# Patient Record
Sex: Female | Born: 1955 | Race: Black or African American | Hispanic: No | Marital: Single | Smoking: Never smoker
Health system: Southern US, Community
[De-identification: ages and names within clinical notes are randomized; demographics above are authoritative.]

---

## 2004-12-16 ENCOUNTER — Emergency Department: Payer: Self-pay | Admitting: Emergency Medicine

## 2004-12-17 ENCOUNTER — Other Ambulatory Visit: Payer: Self-pay

## 2005-03-18 ENCOUNTER — Emergency Department: Payer: Self-pay | Admitting: Emergency Medicine

## 2005-03-29 ENCOUNTER — Emergency Department: Payer: Self-pay | Admitting: Emergency Medicine

## 2006-07-07 IMAGING — CR CERVICAL SPINE - 2-3 VIEW
1 series · 4 of 4 positions shown · non-contrast
Comparison: none

REASON FOR EXAM: neck pain       rm 14
COMMENTS:

[Series 1: view not recorded · 0.17mm/px · 4 of 4 slices shown]
[im 1/4]
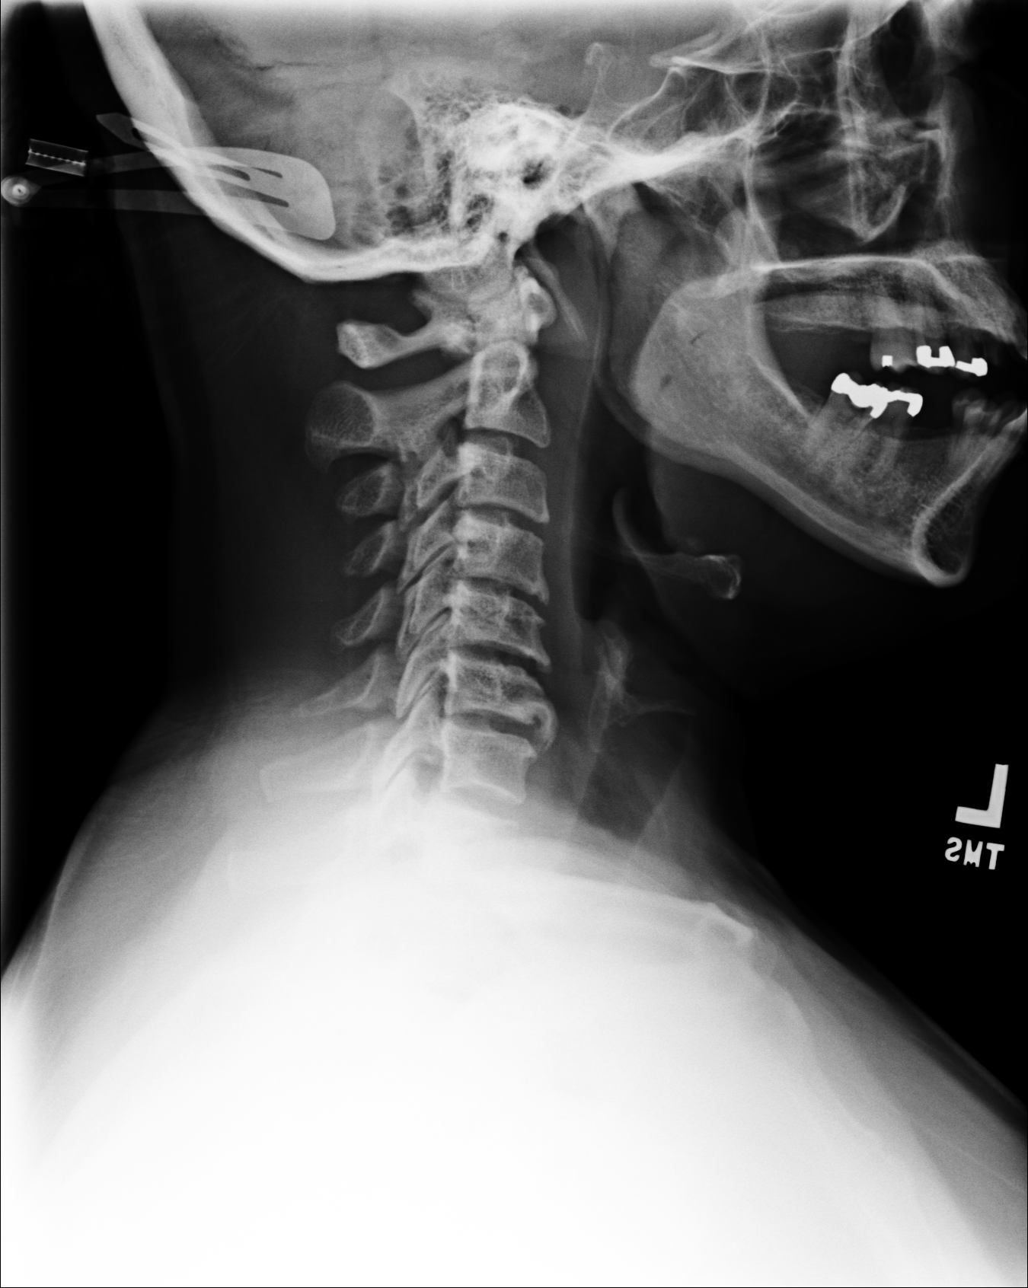
[im 2/4]
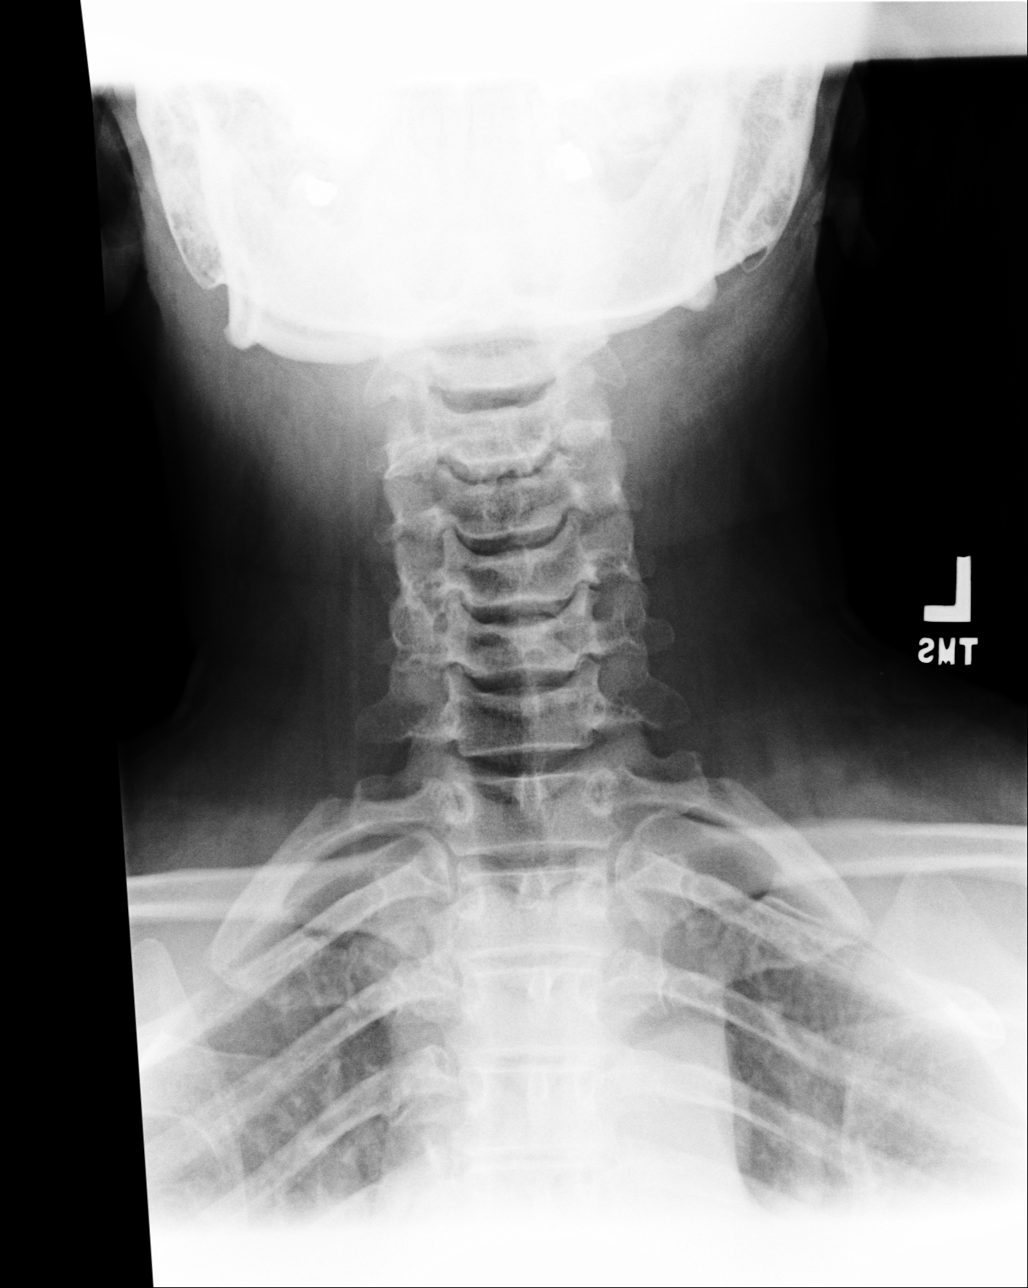
[im 3/4]
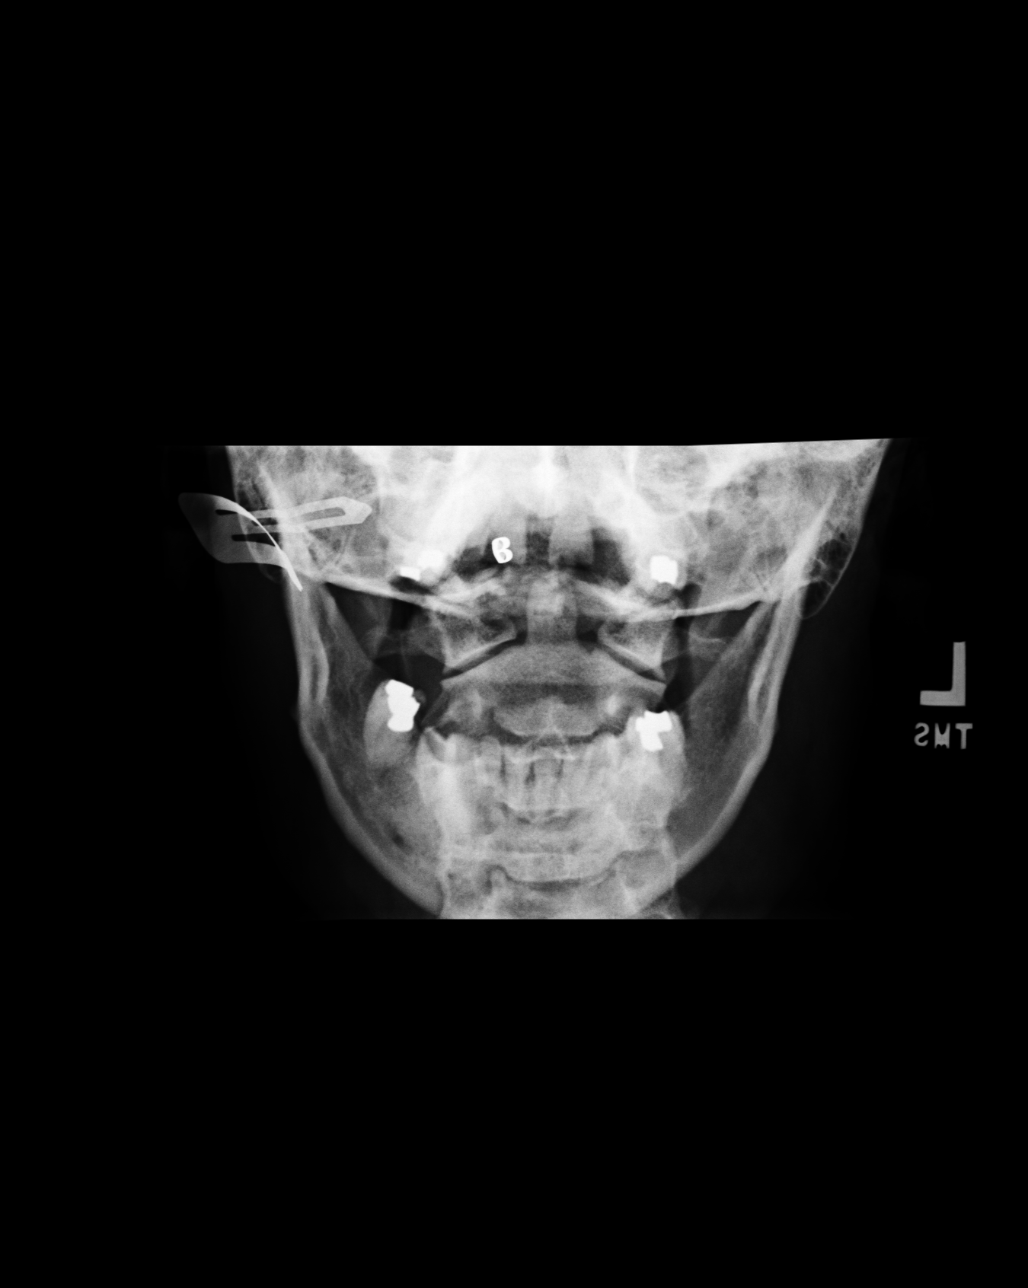
[im 4/4]
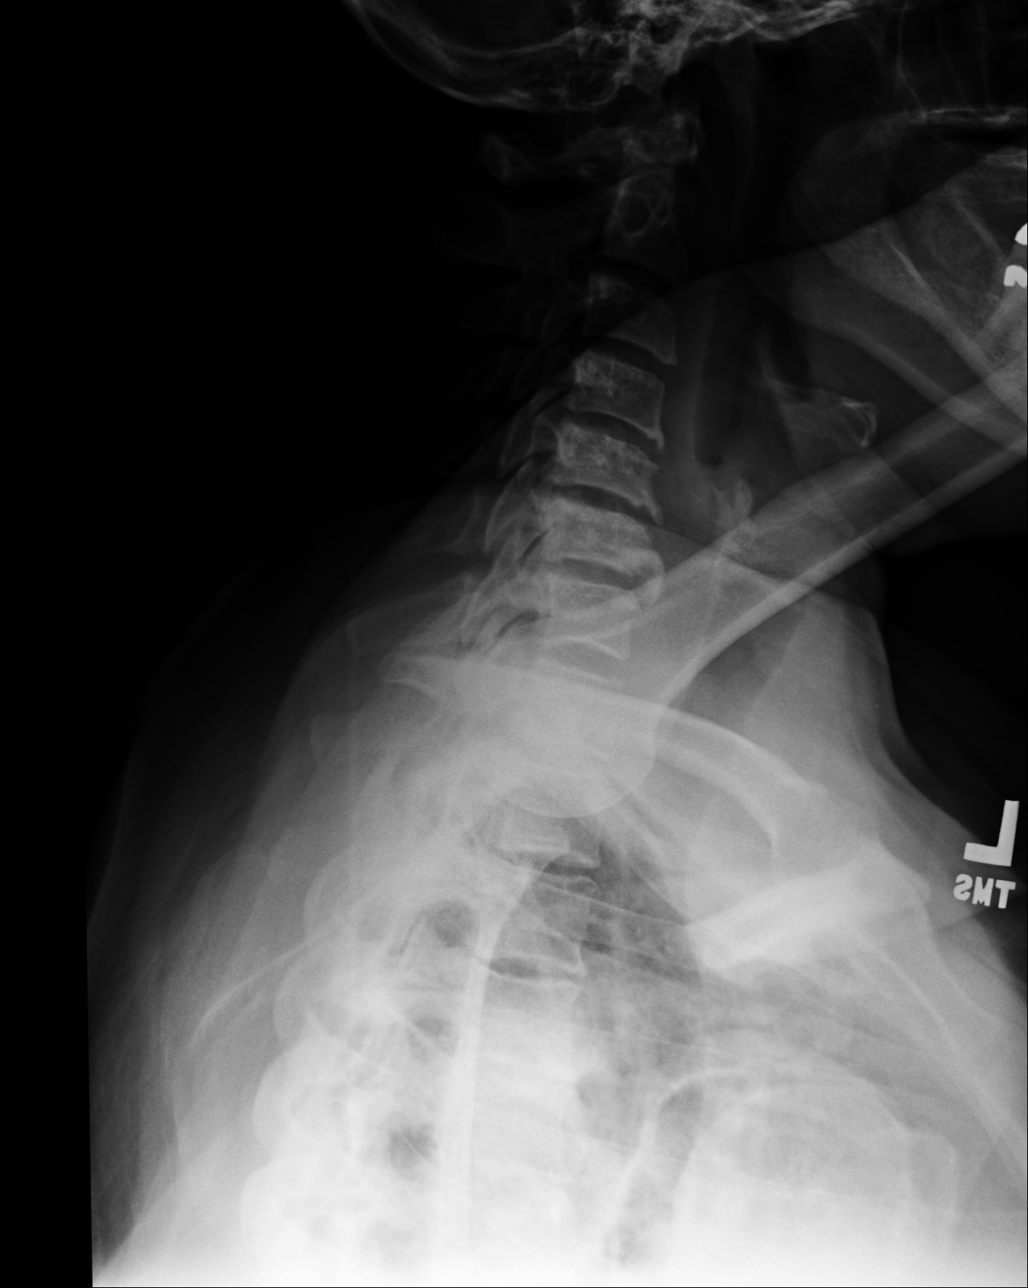

[4 of 4 positions shown; findings below may reference images not displayed]

PROCEDURE:     DXR - DXR C- SPINE AP AND LATERAL  - March 18, 2005  [DATE]

RESULT:       The vertebral body heights are well maintained.  No fracture
is seen.  No definite disc space narrowing is observed.  There is noted
hypertrophic spurring anteriorly at multiple levels of the cervical spine.
In the lateral view there is absence of the usual lordotic curvature.  This
is a nonspecific finding which can be chronic, positional or secondary to
cervical muscle spasm.  No cervical rib formation is seen.
IMPRESSION: 1.     No fracture is identified.
2.     Degenerative spurring is noted anteriorly at multiple levels.
3.     There is straightening of the cervical spine which would raise the
question of cervical muscle spasm.

## 2009-05-29 ENCOUNTER — Emergency Department: Payer: Self-pay | Admitting: Emergency Medicine

## 2013-04-19 ENCOUNTER — Ambulatory Visit: Payer: Self-pay | Admitting: Podiatry

## 2018-12-07 ENCOUNTER — Ambulatory Visit: Payer: Self-pay | Admitting: Podiatry

## 2018-12-17 ENCOUNTER — Ambulatory Visit: Payer: Self-pay | Admitting: Podiatry

## 2019-01-07 ENCOUNTER — Ambulatory Visit: Payer: Self-pay | Admitting: Podiatry

## 2019-02-18 ENCOUNTER — Other Ambulatory Visit: Payer: Self-pay

## 2019-02-18 ENCOUNTER — Other Ambulatory Visit: Payer: Self-pay | Admitting: *Deleted

## 2019-02-18 ENCOUNTER — Ambulatory Visit: Payer: Self-pay | Admitting: Podiatry

## 2019-02-18 ENCOUNTER — Ambulatory Visit (INDEPENDENT_AMBULATORY_CARE_PROVIDER_SITE_OTHER): Payer: Self-pay | Admitting: Podiatry

## 2019-02-18 ENCOUNTER — Encounter: Payer: Self-pay | Admitting: Podiatry

## 2019-02-18 VITALS — BP 151/92 | HR 61

## 2019-02-18 DIAGNOSIS — Q828 Other specified congenital malformations of skin: Secondary | ICD-10-CM

## 2019-02-18 DIAGNOSIS — M722 Plantar fascial fibromatosis: Secondary | ICD-10-CM

## 2019-02-18 NOTE — Progress Notes (Signed)
This patient presents the office with chief complaint of a painful callus in the center of her right foot.  She says this callus has been present for over a year and causes pain walking and wearing her shoes.  She says she has provided no self treatment nor sought any professional help for this callus problem.  She says this callus has become increasingly painful and she presents the office today for an evaluation and treatment of this callus.  Patient gives a history of having been hospitalized for cellulitis in her right leg and foot.  She also has significant lymphedema both legs and feet.    General Appearance  Alert, conversant and in no acute stress.  Vascular  Dorsalis pedis and posterior tibial  pulses are weakly  palpable  Bilaterally despite swelling both feet..  Capillary return is within normal limits  bilaterally. Temperature is within normal limits  bilaterally.  Neurologic  Senn-Weinstein monofilament wire test within normal limits  bilaterally. Muscle power within normal limits bilaterally.  Nails Thick disfigured discolored nails with subungual debris  from hallux to fifth toes bilaterally. No evidence of bacterial infection or drainage bilaterally.  Orthopedic  No limitations of motion  feet .  No crepitus or effusions noted.  No bony pathology or digital deformities noted.  Pes planus  B/l.  Skin  normotropic skin with no porokeratosis noted bilaterally.  No signs of infections or ulcers noted.  Porokeratosis sub 4th MCJ right foot.  Porokeratosis right foot.  IE.  Debride porokeratosis  Right foot.  RTC prn.  Gardiner Barefoot DPM

## 2019-02-24 ENCOUNTER — Other Ambulatory Visit: Payer: Self-pay

## 2019-02-24 ENCOUNTER — Encounter: Payer: Self-pay | Attending: Internal Medicine | Admitting: Internal Medicine

## 2019-02-24 DIAGNOSIS — I890 Lymphedema, not elsewhere classified: Secondary | ICD-10-CM | POA: Insufficient documentation

## 2019-02-24 NOTE — Progress Notes (Addendum)
Jaclyn, Kramer ([id number]) Visit Report for 02/24/2019 Allergy List Details Patient Name: Jaclyn Kramer, Jaclyn Kramer Date of Service: 02/24/2019 2:15 PM Medical Record Number: [id number] Patient Account Number: [id number] Date of Birth/Sex: 09/25/1955 (63 y.o. F) Treating RN: Curtis Sites Primary Care Tonee Silverstein: PATIENT, NO Other Clinician: Referring Hyla Coard: Referral, Self Treating Ashten Prats/Extender: Maxwell Caul Weeks in Treatment: 0 Allergies Active Allergies No Known Drug Allergies Allergy Notes Electronic Signature(s) Signed: 02/24/2019 4:36:54 PM By: Curtis Sites Entered By: Curtis Sites on 02/24/2019 14:38:49 Jaclyn Kramer ([id number]) -------------------------------------------------------------------------------- Arrival Information Details Patient Name: Jaclyn Kramer Date of Service: 02/24/2019 2:15 PM Medical Record Number: [id number] Patient Account Number: [id number] Date of Birth/Sex: 1956-05-16 (63 y.o. F) Treating RN: Huel Coventry Primary Care Troi Florendo: PATIENT, NO Other Clinician: Referring Darriel Utter: Referral, Self Treating Cimone Fahey/Extender: Altamese [place] in Treatment: 0 Visit Information Patient Arrived: Ambulatory Arrival Time: 14:30 Accompanied By: self Transfer Assistance: None Patient Identification Verified: Yes Secondary Verification Process Completed: Yes Electronic Signature(s) Signed: 02/24/2019 4:24:56 PM By: Dayton Martes RCP, RRT, CHT Entered By: Dayton Martes on 02/24/2019 14:30:47 Jaclyn Kramer ([id number]) -------------------------------------------------------------------------------- Clinic Level of Care Assessment Details Patient Name: Jaclyn, Kramer Date of Service: 02/24/2019 2:15 PM Medical Record Number: [id number] Patient Account Number: [id number] Date of Birth/Sex: May 01, 1956 (63 y.o. F) Treating RN: Huel Coventry Primary Care Cheyne Boulden: PATIENT, NO Other Clinician: Referring Virgal Warmuth: Referral,  Self Treating Nyisha Clippard/Extender: Altamese [place] in Treatment: 0 Clinic Level of Care Assessment Items TOOL 2 Quantity Score []  - Use when only an EandM is performed on the INITIAL visit 0 ASSESSMENTS - Nursing Assessment / Reassessment X - General Physical Exam (combine w/ comprehensive assessment (listed just below) when 1 20 performed on new pt. evals) X- 1 25 Comprehensive Assessment (HX, ROS, Risk Assessments, Wounds Hx, etc.) ASSESSMENTS - Wound and Skin Assessment / Reassessment X - Simple Wound Assessment / Reassessment - one wound 1 5 []  - 0 Complex Wound Assessment / Reassessment - multiple wounds []  - 0 Dermatologic / Skin Assessment (not related to wound area) ASSESSMENTS - Ostomy and/or Continence Assessment and Care []  - Incontinence Assessment and Management 0 []  - 0 Ostomy Care Assessment and Management (repouching, etc.) PROCESS - Coordination of Care X - Simple Patient / Family Education for ongoing care 1 15 []  - 0 Complex (extensive) Patient / Family Education for ongoing care []  - 0 Staff obtains Chiropractor, Records, Test Results / Process Orders []  - 0 Staff telephones HHA, Nursing Homes / Clarify orders / etc []  - 0 Routine Transfer to another Facility (non-emergent condition) []  - 0 Routine Hospital Admission (non-emergent condition) []  - 0 New Admissions / Manufacturing engineer / Ordering NPWT, Apligraf, etc. []  - 0 Emergency Hospital Admission (emergent condition) X- 1 10 Simple Discharge Coordination []  - 0 Complex (extensive) Discharge Coordination PROCESS - Special Needs []  - Pediatric / Minor Patient Management 0 []  - 0 Isolation Patient Management ROSIE, REIF ([id number]) []  - 0 Hearing / Language / Visual special needs []  - 0 Assessment of Community assistance (transportation, D/C planning, etc.) []  - 0 Additional assistance / Altered mentation []  - 0 Support Surface(s) Assessment (bed, cushion, seat, etc.) INTERVENTIONS  - Wound Cleansing / Measurement []  - Wound Imaging (photographs - any number of wounds) 0 []  - 0 Wound Tracing (instead of photographs) X- 1 5 Simple Wound Measurement - one wound []  - 0 Complex Wound Measurement - multiple wounds []  - 0 Simple Wound Cleansing - one wound []  - 0 Complex Wound Cleansing -  multiple wounds INTERVENTIONS - Wound Dressings []  - Small Wound Dressing one or multiple wounds 0 []  - 0 Medium Wound Dressing one or multiple wounds []  - 0 Large Wound Dressing one or multiple wounds []  - 0 Application of Medications - injection INTERVENTIONS - Miscellaneous []  - External ear exam 0 []  - 0 Specimen Collection (cultures, biopsies, blood, body fluids, etc.) []  - 0 Specimen(s) / Culture(s) sent or taken to Lab for analysis []  - 0 Patient Transfer (multiple staff / Michiel SitesHoyer Lift / Similar devices) []  - 0 Simple Staple / Suture removal (25 or less) []  - 0 Complex Staple / Suture removal (26 or more) []  - 0 Hypo / Hyperglycemic Management (close monitor of Blood Glucose) []  - 0 Ankle / Brachial Index (ABI) - do not check if billed separately Has the patient been seen at the hospital within the last three years: Yes Total Score: 80 Level Of Care: New/Established - Level 3 Electronic Signature(s) Signed: 02/24/2019 5:22:27 PM By: Elliot GurneyWoody, BSN, RN, CWS, Kim RN, BSN Entered By: Elliot GurneyWoody, BSN, RN, CWS, Kim on 02/24/2019 15:06:14 Jaclyn ChacoOBERTS, Nasiyah ([id number]) -------------------------------------------------------------------------------- Encounter Discharge Information Details Patient Name: Jaclyn Kramer Date of Service: 02/24/2019 2:15 PM Medical Record Number: [id number] Patient Account Number: [id number] Date of Birth/Sex: 06-21-1955 (63 y.o. F) Treating RN: Huel CoventryWoody, Kim Primary Care Dailen Mcclish: PATIENT, NO Other Clinician: Referring Berlie Hatchel: Referral, Self Treating Cache Bills/Extender: Altamese CarolinaOBSON, MICHAEL G Weeks in Treatment: 0 Encounter Discharge Information  Items Discharge Condition: Stable Ambulatory Status: Ambulatory Discharge Destination: Home Transportation: Private Auto Accompanied By: self Schedule Follow-up Appointment: Yes Clinical Summary of Care: Electronic Signature(s) Signed: 02/24/2019 5:22:27 PM By: Elliot GurneyWoody, BSN, RN, CWS, Kim RN, BSN Entered By: Elliot GurneyWoody, BSN, RN, CWS, Kim on 02/24/2019 15:08:37 Jaclyn ChacoOBERTS, Viann ([id number]) -------------------------------------------------------------------------------- Lower Extremity Assessment Details Patient Name: Jaclyn ChacoOBERTS, Charlotta Date of Service: 02/24/2019 2:15 PM Medical Record Number: [id number] Patient Account Number: [id number] Date of Birth/Sex: 06-21-1955 (63 y.o. F) Treating RN: Curtis Sitesorthy, Joanna Primary Care Romani Wilbon: PATIENT, NO Other Clinician: Referring Aalyssa Elderkin: Referral, Self Treating Kensie Susman/Extender: Maxwell CaulOBSON, MICHAEL G Weeks in Treatment: 0 Edema Assessment Assessed: [Left: No] [Right: No] Edema: [Left: Yes] [Right: Yes] Calf Left: Right: Point of Measurement: 34 cm From Medial Instep 45.5 cm 50.5 cm Ankle Left: Right: Point of Measurement: 9 cm From Medial Instep 33.5 cm 34.5 cm Vascular Assessment Pulses: Dorsalis Pedis Palpable: [Left:Yes] [Right:Yes] Doppler Audible: [Left:Yes] [Right:Yes] Posterior Tibial Palpable: [Left:No] [Right:No] Doppler Audible: [Left:Inaudible] [Right:Inaudible] Blood Pressure: Brachial: [Left:126] Ankle: [Left:Dorsalis Pedis: 150 1.19] Notes patient refused ABI in right leg r/t pain Electronic Signature(s) Signed: 02/24/2019 4:36:54 PM By: Curtis Sitesorthy, Joanna Entered By: Curtis Sitesorthy, Joanna on 02/24/2019 14:54:09 Jaclyn ChacoOBERTS, Ray ([id number]) -------------------------------------------------------------------------------- Multi Wound Chart Details Patient Name: Jaclyn ChacoOBERTS, Leandra Date of Service: 02/24/2019 2:15 PM Medical Record Number: [id number] Patient Account Number: [id number] Date of Birth/Sex: 06-21-1955 (63 y.o. F) Treating RN: Huel CoventryWoody,  Kim Primary Care Egan Sahlin: PATIENT, NO Other Clinician: Referring Duncan Alejandro: Referral, Self Treating Beola Vasallo/Extender: Maxwell CaulOBSON, MICHAEL G Weeks in Treatment: 0 Vital Signs Height(in): 63 Pulse(bpm): 58 Weight(lbs): 200 Blood Pressure(mmHg): 135/82 Body Mass Index(BMI): 35 Temperature(F): 98.5 Respiratory Rate 16 (breaths/min): Wound Assessments Treatment Notes Electronic Signature(s) Signed: 02/24/2019 5:11:23 PM By: Baltazar Najjarobson, Michael MD Entered By: Baltazar Najjarobson, Michael on 02/24/2019 15:52:15 Jaclyn ChacoROBERTS, Arleatha ([id number]) -------------------------------------------------------------------------------- Multi-Disciplinary Care Plan Details Patient Name: Jaclyn ChacoOBERTS, Eulogia Date of Service: 02/24/2019 2:15 PM Medical Record Number: [id number] Patient Account Number: [id number] Date of Birth/Sex: 06-21-1955 (63 y.o. F) Treating RN: Huel CoventryWoody, Kim Primary Care Sharman Garrott: PATIENT, NO Other Clinician: Referring Myreon Wimer: Referral, Self Treating Yalanda Soderman/Extender:  ROBSON, MICHAEL G Weeks in Treatment: 0 Active Inactive Engineer, maintenance) Signed: 03/01/2019 1:40:17 PM By: Gretta Cool, BSN, RN, CWS, Kim RN, BSN Previous Signature: 02/24/2019 5:22:27 PM Version By: Gretta Cool, BSN, RN, CWS, Kim RN, BSN Entered By: Gretta Cool, BSN, RN, CWS, Kim on 03/01/2019 13:40:17 [name] ([id number]) -------------------------------------------------------------------------------- Non-Wound Condition Assessment Details Patient Name: [name] Date of Service: 02/24/2019 2:15 PM Medical Record Number: [id number] Patient Account Number: [id number] Date of Birth/Sex: 1955/12/10 (63 y.o. F) Treating RN: Montey Hora Primary Care Marsden Zaino: PATIENT, NO Other Clinician: Referring Randolf Sansoucie: Referral, Self Treating Juanantonio Stolar/Extender: Ricard Dillon Weeks in Treatment: 0 Non-Wound Condition: Condition: Lymphedema Location: Leg Side: Bilateral Photos Electronic Signature(s) Signed: 02/24/2019 4:36:54 PM By: Montey Hora Entered By: Montey Hora on 02/24/2019 14:45:40 [name] ([id number]) -------------------------------------------------------------------------------- Pain Assessment Details Patient Name: [name] Date of Service: 02/24/2019 2:15 PM Medical Record Number: [id number] Patient Account Number: [id number] Date of Birth/Sex: 01-22-1956 (63 y.o. F) Treating RN: Cornell Barman Primary Care Gennie Eisinger: PATIENT, NO Other Clinician: Referring Mallie Linnemann: Referral, Self Treating Jennelle Pinkstaff/Extender: Ricard Dillon Weeks in Treatment: 0 Active Problems Location of Pain Severity and Description of Pain Patient Has Paino Yes Site Locations Rate the pain. Current Pain Level: 7 Pain Management and Medication Current Pain Management: Electronic Signature(s) Signed: 02/24/2019 4:24:56 PM By: Lorine Bears RCP, RRT, CHT Signed: 02/24/2019 5:22:27 PM By: Gretta Cool, BSN, RN, CWS, Kim RN, BSN Entered By: Lorine Bears on 02/24/2019 14:32:02 TRIVIA, HEFFELFINGER ([id number]) -------------------------------------------------------------------------------- Patient/Caregiver Education Details Patient Name: [name] Date of Service: 02/24/2019 2:15 PM Medical Record Number: [id number] Patient Account Number: [id number] Date of Birth/Gender: 02/28/1956 (63 y.o. F) Treating RN: Cornell Barman Primary Care Physician: PATIENT, NO Other Clinician: Referring Physician: Referral, Self Treating Physician/Extender: Tito Dine in Treatment: 0 Education Assessment Education Provided To: Patient Education Topics Provided Venous: Handouts: Other: lympedema clinic referral Methods: Explain/Verbal Responses: State content correctly Wound/Skin Impairment: Electronic Signature(s) Signed: 02/24/2019 5:22:27 PM By: Gretta Cool, BSN, RN, CWS, Kim RN, BSN Entered By: Gretta Cool, BSN, RN, CWS, Kim on 02/24/2019 15:07:16 [name]  ([id number]) -------------------------------------------------------------------------------- Vitals Details Patient Name: [name] Date of Service: 02/24/2019 2:15 PM Medical Record Number: [id number] Patient Account Number: [id number] Date of Birth/Sex: 11-23-1955 (63 y.o. F) Treating RN: Cornell Barman Primary Care Aleshka Corney: PATIENT, NO Other Clinician: Referring Zyshonne Malecha: Referral, Self Treating Jaelyn Cloninger/Extender: Tito Dine in Treatment: 0 Vital Signs Time Taken: 14:32 Temperature (F): 98.5 Height (in): 63 Pulse (bpm): 58 Source: Stated Respiratory Rate (breaths/min): 16 Weight (lbs): 200 Blood Pressure (mmHg): 135/82 Source: Measured Reference Range: 80 - 120 mg / dl Body Mass Index (BMI): 35.4 Electronic Signature(s) Signed: 02/24/2019 4:24:56 PM By: Lorine Bears RCP, RRT, CHT Entered By: Lorine Bears on 02/24/2019 14:33:30

## 2019-02-24 NOTE — Progress Notes (Signed)
Jaclyn Kramer, [name] ([id number]) Visit Report for 02/24/2019 Abuse/Suicide Risk Screen Details Patient Name: Jaclyn Kramer, Jaclyn Kramer Date of Service: 02/24/2019 2:15 PM Medical Record Number: [id number] Patient Account Number: [id number] Date of Birth/Sex: 1956/01/31 (63 y.o. F) Treating RN: Curtis Sitesorthy, Joanna Primary Care Nirvi Boehler: PATIENT, NO Other Clinician: Referring Marieme Mcmackin: Referral, Self Treating Ciarah Peace/Extender: Maxwell CaulOBSON, MICHAEL G Weeks in Treatment: 0 Abuse/Suicide Risk Screen Items Answer ABUSE RISK SCREEN: Has anyone close to you tried to hurt or harm you recentlyo No Do you feel uncomfortable with anyone in your familyo No Has anyone forced you do things that you didnot want to doo No Electronic Signature(s) Signed: 02/24/2019 4:36:54 PM By: Curtis Sitesorthy, Joanna Entered By: Curtis Sitesorthy, Joanna on 02/24/2019 14:42:33 Jaclyn ChacoOBERTS, Jaclyn Kramer ([id number]) -------------------------------------------------------------------------------- Activities of Daily Living Details Patient Name: Jaclyn ChacoOBERTS, Jaclyn Kramer Date of Service: 02/24/2019 2:15 PM Medical Record Number: [id number] Patient Account Number: [id number] Date of Birth/Sex: 1956/01/31 (63 y.o. F) Treating RN: Curtis Sitesorthy, Joanna Primary Care Burns Timson: PATIENT, NO Other Clinician: Referring Javonnie Illescas: Referral, Self Treating Kimmerly Lora/Extender: Maxwell CaulOBSON, MICHAEL G Weeks in Treatment: 0 Activities of Daily Living Items Answer Activities of Daily Living (Please select one for each item) Drive Automobile Completely Able Take Medications Completely Able Use Telephone Completely Able Care for Appearance Completely Able Use Toilet Completely Able Bath / Shower Completely Able Dress Self Completely Able Feed Self Completely Able Walk Completely Able Get In / Out Bed Completely Able Housework Completely Able Prepare Meals Completely Able Handle Money Completely Able Shop for Self Completely Able Electronic Signature(s) Signed: 02/24/2019 4:36:54 PM By: Curtis Sitesorthy, Joanna Entered By:  Curtis Sitesorthy, Joanna on 02/24/2019 14:42:55 Jaclyn ChacoOBERTS, Krystalyn ([id number]) -------------------------------------------------------------------------------- Education Screening Details Patient Name: Jaclyn ChacoOBERTS, Jaclyn Kramer Date of Service: 02/24/2019 2:15 PM Medical Record Number: [id number] Patient Account Number: [id number] Date of Birth/Sex: 1956/01/31 (63 y.o. F) Treating RN: Curtis Sitesorthy, Joanna Primary Care Rashaud Ybarbo: PATIENT, NO Other Clinician: Referring Jazia Faraci: Referral, Self Treating Buryl Bamber/Extender: Altamese CarolinaOBSON, MICHAEL G Weeks in Treatment: 0 Primary Learner Assessed: Patient Learning Preferences/Education Level/Primary Language Learning Preference: Explanation, Demonstration Highest Education Level: High School Preferred Language: English Cognitive Barrier Language Barrier: No Translator Needed: No Memory Deficit: No Emotional Barrier: No Cultural/Religious Beliefs Affecting Medical Care: No Physical Barrier Impaired Vision: No Impaired Hearing: No Decreased Hand dexterity: No Knowledge/Comprehension Knowledge Level: Medium Comprehension Level: Medium Ability to understand written Medium instructions: Ability to understand verbal Medium instructions: Motivation Anxiety Level: Calm Cooperation: Cooperative Education Importance: Acknowledges Need Interest in Health Problems: Asks Questions Perception: Coherent Willingness to Engage in Self- Medium Management Activities: Readiness to Engage in Self- Medium Management Activities: Electronic Signature(s) Signed: 02/24/2019 4:36:54 PM By: Curtis Sitesorthy, Joanna Entered By: Curtis Sitesorthy, Joanna on 02/24/2019 14:43:21 Jaclyn Kramer, [name] ([id number]) -------------------------------------------------------------------------------- Fall Risk Assessment Details Patient Name: Jaclyn ChacoOBERTS, Jaclyn Kramer Date of Service: 02/24/2019 2:15 PM Medical Record Number: [id number] Patient Account Number: [id number] Date of Birth/Sex: 1956/01/31 38(63 y.o. F) Treating RN: Curtis Sitesorthy,  Joanna Primary Care Atira Borello: PATIENT, NO Other Clinician: Referring Merlyn Bollen: Referral, Self Treating Amato Sevillano/Extender: Altamese CarolinaOBSON, MICHAEL G Weeks in Treatment: 0 Fall Risk Assessment Items Have you had 2 or more falls in the last 12 monthso 0 No Have you had any fall that resulted in injury in the last 12 monthso 0 No FALLS RISK SCREEN History of falling - immediate or within 3 months 0 No Secondary diagnosis (Do you have 2 or more medical diagnoseso) 0 No Ambulatory aid None/bed rest/wheelchair/nurse 0 Yes Crutches/cane/walker 0 No Furniture 0 No Intravenous therapy Access/Saline/Heparin Lock 0 No Gait/Transferring Normal/ bed rest/ wheelchair 0 Yes Weak (short steps with or without shuffle, stooped but  able to lift head while 0 No walking, may seek support from furniture) Impaired (short steps with shuffle, may have difficulty arising from chair, head 0 No down, impaired balance) Mental Status Oriented to own ability 0 Yes Electronic Signature(s) Signed: 02/24/2019 4:36:54 PM By: Montey Hora Entered By: Montey Hora on 02/24/2019 14:43:43 Jaclyn Kramer Morrow ([id number]) -------------------------------------------------------------------------------- Foot Assessment Details Patient Name: Jaclyn Kramer Morrow Date of Service: 02/24/2019 2:15 PM Medical Record Number: [id number] Patient Account Number: [id number] Date of Birth/Sex: 05-06-1956 (63 y.o. F) Treating RN: Montey Hora Primary Care Karis Rilling: PATIENT, NO Other Clinician: Referring Muna Demers: Referral, Self Treating Summerlynn Glauser/Extender: Ricard Dillon Weeks in Treatment: 0 Foot Assessment Items Site Locations + = Sensation present, - = Sensation absent, C = Callus, U = Ulcer R = Redness, W = Warmth, M = Maceration, PU = Pre-ulcerative lesion F = Fissure, S = Swelling, D = Dryness Assessment Right: Left: Other Deformity: No No Prior Foot Ulcer: No No Prior Amputation: No No Charcot Joint: No No Ambulatory Status:  Ambulatory Without Help Gait: Steady Electronic Signature(s) Signed: 02/24/2019 4:36:54 PM By: Montey Hora Entered By: Montey Hora on 02/24/2019 14:43:57 Jaclyn Kramer Morrow ([id number]) -------------------------------------------------------------------------------- Nutrition Risk Screening Details Patient Name: Jaclyn Kramer Morrow Date of Service: 02/24/2019 2:15 PM Medical Record Number: [id number] Patient Account Number: [id number] Date of Birth/Sex: [birth year] (63 y.o. F) Treating RN: Montey Hora Primary Care Aalia Greulich: PATIENT, NO Other Clinician: Referring Caia Lofaro: Referral, Self Treating Ilias Stcharles/Extender: Ricard Dillon Weeks in Treatment: 0 Height (in): 63 Weight (lbs): 200 Body Mass Index (BMI): 35.4 Nutrition Risk Screening Items Score Screening NUTRITION RISK SCREEN: I have an illness or condition that made me change the kind and/or amount of 0 No food I eat I eat fewer than two meals per day 0 No I eat few fruits and vegetables, or milk products 0 No I have three or more drinks of beer, liquor or wine almost every day 0 No I have tooth or mouth problems that make it hard for me to eat 0 No I don't always have enough money to buy the food I need 0 No I eat alone most of the time 0 No I take three or more different prescribed or over-the-counter drugs a day 1 Yes Without wanting to, I have lost or gained 10 pounds in the last six months 0 No I am not always physically able to shop, cook and/or feed myself 0 No Nutrition Protocols Good Risk Protocol 0 No interventions needed Moderate Risk Protocol High Risk Proctocol Risk Level: Good Risk Score: 1 Electronic Signature(s) Signed: 02/24/2019 4:36:54 PM By: Montey Hora Entered By: Montey Hora on 02/24/2019 14:43:50

## 2019-02-24 NOTE — Progress Notes (Signed)
Jaclyn, Kramer ([id number]) Visit Report for 02/24/2019 Chief Complaint Document Details Patient Name: Jaclyn Kramer, Jaclyn Kramer Date of Service: 02/24/2019 2:15 PM Medical Record Number: [id number] Patient Account Number: [id number] Date of Birth/Sex: 08-Aug-1955 (63 y.o. F) Treating RN: Huel Coventry Primary Care Provider: PATIENT, NO Other Clinician: Referring Provider: Referral, Self Treating Provider/Extender: Altamese [place] in Treatment: 0 Information Obtained from: Patient Chief Complaint 02/24/2019; patient is here for review of bilateral lower extremity swelling right greater than left Electronic Signature(s) Signed: 02/24/2019 5:11:23 PM By: Baltazar Najjar MD Entered By: Baltazar Najjar on 02/24/2019 15:53:22 Jaclyn Kramer ([id number]) -------------------------------------------------------------------------------- HPI Details Patient Name: Jaclyn Kramer Date of Service: 02/24/2019 2:15 PM Medical Record Number: [id number] Patient Account Number: [id number] Date of Birth/Sex: 11-12-1955 (63 y.o. F) Treating RN: Huel Coventry Primary Care Provider: PATIENT, NO Other Clinician: Referring Provider: Referral, Self Treating Provider/Extender: Altamese [place] in Treatment: 0 History of Present Illness HPI Description: All C admission 02/24/19 This is a 63 year old woman who tells [place] she recently was hospitalized at a hospital in [facility] for cellulitis of her right lower extremity. She just finished doxycycline this morning that she received at discharge. She has chronic bilateral edema right greater than left. She states that the she still has quite a bit of pain in the right leg and it is tender to the touch. She states she did have an ultrasound while in hospital but I do not have access to these records. She does not have a wound history. She has not worn compression on her leg. Past medical history; the patient is not a diabetic or smoker. She has a history of  plantar fasciitis, major depressive disorder, "prediabetes", history of lymphedema and arthritis. ABIs in our clinic on the right could not be tested because of discomfort. On the left this was 1.19 Electronic Signature(s) Signed: 02/24/2019 5:11:23 PM By: Baltazar Najjar MD Entered By: Baltazar Najjar on 02/24/2019 15:55:21 HAVA, [place] ([id number]) -------------------------------------------------------------------------------- Physical Exam Details Patient Name: Jaclyn Kramer Date of Service: 02/24/2019 2:15 PM Medical Record Number: [id number] Patient Account Number: [id number] Date of Birth/Sex: 06/04/1956 (63 y.o. F) Treating RN: Huel Coventry Primary Care Provider: PATIENT, NO Other Clinician: Referring Provider: Referral, Self Treating Provider/Extender: Maxwell Caul Weeks in Treatment: 0 Constitutional Sitting or standing Blood Pressure is within target range for patient.. Pulse regular and within target range for patient.Marland Kitchen Respirations regular, non-labored and within target range.. Temperature is normal and within the target range for the patient.Marland Kitchen appears in no distress. Eyes Conjunctivae clear. No discharge. Respiratory Respiratory effort is easy and symmetric bilaterally. Rate is normal at rest and on room air.. Bilateral breath sounds are clear and equal in all lobes with no wheezes, rales or rhonchi.. Cardiovascular Heart rhythm and rate regular, without murmur or gallop.. Femoral pulse on the right I believe is palpable. Pedal pulses are palpable. Nonpitting edema in the right greater than the left leg below the knee. Lymphatic None palpable in the popliteal or inguinal area. Integumentary (Hair, Skin) Other than skin changes in the right lower extremity which I think are all secondary to lymphedema no other systemic skin issues are seen. Psychiatric No evidence of depression, anxiety, or agitation. Calm, cooperative, and communicative. Appropriate interactions and  affect.. Notes Wound exam; there is no open wound here. There is however marked damage to the skin predominantly on the right anterior pre-tibia area widely. Thick hyperkeratotic nonelastic skin. Marked swelling of the right greater than left leg which is nonpitting. There is no  erythema and really no evidence of current cellulitis. Electronic Signature(s) Signed: 02/24/2019 5:11:23 PM By: Baltazar Najjar MD Entered By: Baltazar Najjar on 02/24/2019 15:57:18 Jaclyn Kramer ([id number]) -------------------------------------------------------------------------------- Physician Orders Details Patient Name: Jaclyn Kramer Date of Service: 02/24/2019 2:15 PM Medical Record Number: [id number] Patient Account Number: [id number] Date of Birth/Sex: 1956/03/19 (63 y.o. F) Treating RN: Huel Coventry Primary Care Provider: PATIENT, NO Other Clinician: Referring Provider: Referral, Self Treating Provider/Extender: Altamese [place] in Treatment: 0 Verbal / Phone Orders: No Diagnosis Coding Skin Barriers/Peri-Wound Care o Moisturizing lotion - lotion every night before bed Edema Control o Elevate legs to the level of the heart and pump ankles as often as possible Services and Therapies o Lymphedema Clinic - Bilateral Electronic Signature(s) Signed: 02/24/2019 5:11:23 PM By: Baltazar Najjar MD Signed: 02/24/2019 5:22:27 PM By: Elliot Gurney, BSN, RN, CWS, Kim RN, BSN Entered By: Elliot Gurney, BSN, RN, CWS, Kim on 02/24/2019 15:05:38 Jaclyn Kramer ([id number]) -------------------------------------------------------------------------------- Problem List Details Patient Name: Jaclyn Kramer Date of Service: 02/24/2019 2:15 PM Medical Record Number: [id number] Patient Account Number: [id number] Date of Birth/Sex: 08/06/1955 (63 y.o. F) Treating RN: Huel Coventry Primary Care Provider: PATIENT, NO Other Clinician: Referring Provider: Referral, Self Treating Provider/Extender: Altamese [place] in Treatment:  0 Active Problems ICD-10 Evaluated Encounter Code Description Active Date Today Diagnosis I89.0 Lymphedema, not elsewhere classified 02/24/2019 No Yes L03.115 Cellulitis of right lower limb 02/24/2019 No Yes Inactive Problems Resolved Problems Electronic Signature(s) Signed: 02/24/2019 5:11:23 PM By: Baltazar Najjar MD Entered By: Baltazar Najjar on 02/24/2019 15:52:04 Jaclyn Kramer ([id number]) -------------------------------------------------------------------------------- Progress Note Details Patient Name: Jaclyn Kramer Date of Service: 02/24/2019 2:15 PM Medical Record Number: [id number] Patient Account Number: [id number] Date of Birth/Sex: 06/16/1955 (63 y.o. F) Treating RN: Huel Coventry Primary Care Provider: PATIENT, NO Other Clinician: Referring Provider: Referral, Self Treating Provider/Extender: Altamese [place] in Treatment: 0 Subjective Chief Complaint Information obtained from Patient 02/24/2019; patient is here for review of bilateral lower extremity swelling right greater than left History of Present Illness (HPI) All C admission 02/24/19 This is a 63 year old woman who tells [place] she recently was hospitalized at a hospital in [facility] for cellulitis of her right lower extremity. She just finished doxycycline this morning that she received at discharge. She has chronic bilateral edema right greater than left. She states that the she still has quite a bit of pain in the right leg and it is tender to the touch. She states she did have an ultrasound while in hospital but I do not have access to these records. She does not have a wound history. She has not worn compression on her leg. Past medical history; the patient is not a diabetic or smoker. She has a history of plantar fasciitis, major depressive disorder, "prediabetes", history of lymphedema and arthritis. ABIs in our clinic on the right could not be tested because of discomfort. On the left this  was 1.19 Patient History Information obtained from Patient. Allergies No Known Drug Allergies Family History Cancer - Mother,Father, Diabetes - Siblings,Mother, Heart Disease - Father, Hypertension - Father, Lung Disease - Father, Seizures - Siblings, No family history of Hereditary Spherocytosis, Kidney Disease, Stroke, Thyroid Problems, Tuberculosis. Social History Never smoker, Marital Status - Single, Alcohol Use - Never, Drug Use - No History, Caffeine Use - Never. Medical History Hematologic/Lymphatic Patient has history of Lymphedema Denies history of Anemia, Hemophilia, Human Immunodeficiency Virus, Sickle Cell Disease Cardiovascular Patient has history of Hypertension Denies history of Angina, Arrhythmia, Congestive Heart  Failure, Coronary Artery Disease, Deep Vein Thrombosis, Hypotension, Myocardial Infarction, Peripheral Arterial Disease, Peripheral Venous Disease, Phlebitis, Vasculitis Integumentary (Skin) Denies history of History of Burn, History of pressure wounds Musculoskeletal Jaclyn ChacoROBERTS, Cadience ([id number]) Patient has history of Osteoarthritis Denies history of Gout, Rheumatoid Arthritis, Osteomyelitis Review of Systems (ROS) Constitutional Symptoms (General Health) Denies complaints or symptoms of Fatigue, Fever, Chills, Marked Weight Change. Eyes Denies complaints or symptoms of Dry Eyes, Vision Changes, Glasses / Contacts. Ear/Nose/Mouth/Throat Denies complaints or symptoms of Difficult clearing ears, Sinusitis. Hematologic/Lymphatic Denies complaints or symptoms of Bleeding / Clotting Disorders, Human Immunodeficiency Virus. Respiratory Denies complaints or symptoms of Chronic or frequent coughs, Shortness of Breath. Cardiovascular Complains or has symptoms of LE edema. Denies complaints or symptoms of Chest pain. Gastrointestinal Denies complaints or symptoms of Frequent diarrhea, Nausea, Vomiting. Endocrine Denies complaints or symptoms of Hepatitis,  Thyroid disease, Polydypsia (Excessive Thirst). Genitourinary Denies complaints or symptoms of Kidney failure/ Dialysis, Incontinence/dribbling. Immunological Denies complaints or symptoms of Hives, Itching. Integumentary (Skin) Complains or has symptoms of Swelling. Denies complaints or symptoms of Wounds, Bleeding or bruising tendency, Breakdown. Musculoskeletal Denies complaints or symptoms of Muscle Pain, Muscle Weakness. Neurologic Denies complaints or symptoms of Numbness/parasthesias, Focal/Weakness. Psychiatric Denies complaints or symptoms of Anxiety, Claustrophobia. Objective Constitutional Sitting or standing Blood Pressure is within target range for patient.. Pulse regular and within target range for patient.Marland Kitchen. Respirations regular, non-labored and within target range.. Temperature is normal and within the target range for the patient.Marland Kitchen. appears in no distress. Vitals Time Taken: 2:32 PM, Height: 63 in, Source: Stated, Weight: 200 lbs, Source: Measured, BMI: 35.4, Temperature: 98.5 F, Pulse: 58 bpm, Respiratory Rate: 16 breaths/min, Blood Pressure: 135/82 mmHg. Eyes Conjunctivae clear. No discharge. Respiratory Respiratory effort is easy and symmetric bilaterally. Rate is normal at rest and on room air.. Bilateral breath sounds are clear Ohms, Rithika ([id number]) and equal in all lobes with no wheezes, rales or rhonchi.. Cardiovascular Heart rhythm and rate regular, without murmur or gallop.. Femoral pulse on the right I believe is palpable. Pedal pulses are palpable. Nonpitting edema in the right greater than the left leg below the knee. Lymphatic None palpable in the popliteal or inguinal area. Psychiatric No evidence of depression, anxiety, or agitation. Calm, cooperative, and communicative. Appropriate interactions and affect.. General Notes: Wound exam; there is no open wound here. There is however marked damage to the skin predominantly on the right anterior  pre-tibia area widely. Thick hyperkeratotic nonelastic skin. Marked swelling of the right greater than left leg which is nonpitting. There is no erythema and really no evidence of current cellulitis. Integumentary (Hair, Skin) Other than skin changes in the right lower extremity which I think are all secondary to lymphedema no other systemic skin issues are seen. Assessment Active Problems ICD-10 Lymphedema, not elsewhere classified Cellulitis of right lower limb Plan Skin Barriers/Peri-Wound Care: Moisturizing lotion - lotion every night before bed Edema Control: Elevate legs to the level of the heart and pump ankles as often as possible Services and Therapies ordered were: Lymphedema Clinic - Bilateral 1. The patient does not have any open wounds and does not need to be followed here. 2. She tells me that she has 2 other great siblings who have similar legs to hers. Nevertheless she is quite adamant that this did not happen at an early age suggesting that it did happen to recur in her 8050s. At this point I think a referral to the lymphedema clinic today is in order. I think if we  can control the swelling in the right greater than the left leg with lymphedema or wraps and manual massage we should be able to transition her safely into compression stockings. 3. The patient was recently admitted to the hospital with cellulitis I do not see evidence of that activity now. Her discomfort could be related to the edema she has especially in the right leg DEAIRRA, HALLECK ([id number]) Electronic Signature(s) Signed: 02/24/2019 5:11:23 PM By: Linton Ham MD Entered By: Linton Ham on 02/24/2019 15:59:05 [name] ([id number]) -------------------------------------------------------------------------------- ROS/PFSH Details Patient Name: [name] Date of Service: 02/24/2019 2:15 PM Medical Record Number: [id number] Patient Account Number: [id number] Date of Birth/Sex: Jul 29, 1955 (63  y.o. F) Treating RN: Montey Hora Primary Care Provider: PATIENT, NO Other Clinician: Referring Provider: Referral, Self Treating Provider/Extender: Tito Dine in Treatment: 0 Information Obtained From Patient Constitutional Symptoms (General Health) Complaints and Symptoms: Negative for: Fatigue; Fever; Chills; Marked Weight Change Eyes Complaints and Symptoms: Negative for: Dry Eyes; Vision Changes; Glasses / Contacts Ear/Nose/Mouth/Throat Complaints and Symptoms: Negative for: Difficult clearing ears; Sinusitis Hematologic/Lymphatic Complaints and Symptoms: Negative for: Bleeding / Clotting Disorders; Human Immunodeficiency Virus Medical History: Positive for: Lymphedema Negative for: Anemia; Hemophilia; Human Immunodeficiency Virus; Sickle Cell Disease Respiratory Complaints and Symptoms: Negative for: Chronic or frequent coughs; Shortness of Breath Cardiovascular Complaints and Symptoms: Positive for: LE edema Negative for: Chest pain Medical History: Positive for: Hypertension Negative for: Angina; Arrhythmia; Congestive Heart Failure; Coronary Artery Disease; Deep Vein Thrombosis; Hypotension; Myocardial Infarction; Peripheral Arterial Disease; Peripheral Venous Disease; Phlebitis; Vasculitis Gastrointestinal Complaints and Symptoms: Negative for: Frequent diarrhea; Nausea; Vomiting Endocrine KEESHA, PELLUM ([id number]) Complaints and Symptoms: Negative for: Hepatitis; Thyroid disease; Polydypsia (Excessive Thirst) Genitourinary Complaints and Symptoms: Negative for: Kidney failure/ Dialysis; Incontinence/dribbling Immunological Complaints and Symptoms: Negative for: Hives; Itching Integumentary (Skin) Complaints and Symptoms: Positive for: Swelling Negative for: Wounds; Bleeding or bruising tendency; Breakdown Medical History: Negative for: History of Burn; History of pressure wounds Musculoskeletal Complaints and Symptoms: Negative for:  Muscle Pain; Muscle Weakness Medical History: Positive for: Osteoarthritis Negative for: Gout; Rheumatoid Arthritis; Osteomyelitis Neurologic Complaints and Symptoms: Negative for: Numbness/parasthesias; Focal/Weakness Psychiatric Complaints and Symptoms: Negative for: Anxiety; Claustrophobia Immunizations Pneumococcal Vaccine: Received Pneumococcal Vaccination: No Immunization Notes: up to date Implantable Devices None Family and Social History Cancer: Yes - Mother,Father; Diabetes: Yes - Siblings,Mother; Heart Disease: Yes - Father; Hereditary Spherocytosis: No; Hypertension: Yes - Father; Kidney Disease: No; Lung Disease: Yes - Father; Seizures: Yes - Siblings; Stroke: No; Thyroid Problems: No; Tuberculosis: No; Never smoker; Marital Status - Single; Alcohol Use: Never; Drug Use: No History; Caffeine Use: Never; Financial Concerns: No; Food, Clothing or Shelter Needs: No; Support System Lacking: No; Transportation Concerns: No Electronic Signature(s) NAJMA, BOZARTH ([id number]) Signed: 02/24/2019 4:36:54 PM By: Montey Hora Signed: 02/24/2019 5:11:23 PM By: Linton Ham MD Entered By: Montey Hora on 02/24/2019 14:42:26 PAMELYN, BANCROFT ([id number]) -------------------------------------------------------------------------------- SuperBill Details Patient Name: [name] Date of Service: 02/24/2019 Medical Record Number: [id number] Patient Account Number: [id number] Date of Birth/Sex: [birth year] (63 y.o. F) Treating RN: Cornell Barman Primary Care Provider: PATIENT, NO Other Clinician: Referring Provider: Referral, Self Treating Provider/Extender: Ricard Dillon Weeks in Treatment: 0 Diagnosis Coding ICD-10 Codes Code Description I89.0 Lymphedema, not elsewhere classified L03.115 Cellulitis of right lower limb Facility Procedures CPT4 Code: 79024097 Description: 99213 - WOUND CARE VISIT-LEV 3 EST PT Modifier: Quantity: 1 Physician Procedures CPT4 Code:  3532992 Description: 42683 - WC PHYS LEVEL 2 - NEW PT ICD-10 Diagnosis Description I89.0 Lymphedema, not elsewhere  classified L03.115 Cellulitis of right lower limb Modifier: Quantity: 1 Electronic Signature(s) Signed: 02/24/2019 5:11:23 PM By: Baltazar Najjarobson, Michael MD Entered By: Baltazar Najjarobson, Michael on 02/24/2019 15:59:27

## 2019-03-10 ENCOUNTER — Ambulatory Visit: Payer: Self-pay | Attending: Internal Medicine | Admitting: Occupational Therapy

## 2019-03-10 ENCOUNTER — Encounter: Payer: Self-pay | Admitting: Occupational Therapy

## 2019-03-10 ENCOUNTER — Other Ambulatory Visit: Payer: Self-pay

## 2019-03-10 DIAGNOSIS — I890 Lymphedema, not elsewhere classified: Secondary | ICD-10-CM

## 2019-03-10 NOTE — Therapy (Deleted)
[facility] MAIN [facility] SERVICES [address] [place], [place], [address] Phone: [phone]   Fax:  [phone]  Occupational Therapy Evaluation  Patient Details  Name: Jaclyn Kramer MRN: [id number] Date of Birth: 1956-06-04 Referring Provider (OT): Baltazar Najjar, MD   Encounter Date: 03/10/2019  OT End of Session - 03/10/19 1358    Visit Number  1    Number of Visits  36    Date for OT Re-Evaluation  06/08/19       History reviewed. No pertinent past medical history.  History reviewed. No pertinent surgical history.  There were no vitals filed for this visit.  Subjective Assessment - 03/10/19 1327    Subjective   Jaclyn Kramer is referred to Occupational Therapy for evaluation and treatment of RLE lymphedema  by Baltazar Najjar, MD. Pt reports swelling in R leg started some time ago , but got much worse about a month ago after an episode of cellulitis.  Pt is unable to identify any precipitating even that cause swelling onset intially. Pt endorses family history of leg swelling in her father and also Pt has 2 sisters with leg swelling. Pt reports that two recent ultra sounds on her R leg were negative for DVT.Pt denies hx of leg wounds. Pt has not used compression stockings on the R leg because she has been unable to find any that fit. Pt tells me she has LLE compression knee highs, but she does not wear them. Pt's goal is to get the swelling down and be pain free.    Pertinent History  Hx anemia, HTN, anxiety disorder, major depressive disorder, hx non-adherance to medical treatment;    Limitations  limits ambulation, standing tolerance, climbing, difficulty fitting LB clothing and shoes. difficulty reaching feet (AROM) difficulty bathing    Repetition  Increases Symptoms        [facility] OT Assessment - 03/10/19 0001      Assessment   Medical Diagnosis  Severe , stage II RLE lymphedema    Referring Provider (OT)  Baltazar Najjar, MD    Hand  Dominance  Right    Prior Therapy  none      Balance Screen   Has the patient fallen in the past 6 months  No      Home  Environment   Alternate Level Stairs - Number of Steps  1 story    Bathroom Shower/Tub  Tub/Shower unit;Curtain    Shower/tub characteristics  Curtain    Bathroom Toilet  Handicapped height    Bathroom Accessibility  No    Additional Comments  have tub bench , but not currently using    Lives With  Family   mother, father, son, 2 brothers                                   Patient will benefit from skilled therapeutic intervention in order to improve the following deficits and impairments:           Visit Diagnosis: Lymphedema, not elsewhere classified    Problem List Patient Active Problem List   Diagnosis Date Noted  . 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 Anxiety state 11/28/2009  . Depressive disorder 11/28/2009  . Essential hypertension 11/28/2009    Jaclyn Dubonnet, MS, OTR/L, [facility] 03/10/19 9:19 PM  [facility] MAIN [facility] SERVICES [address] [place], [place], [address] Phone: [phone]   Fax:  [phone]  Name: Jaclyn Kramer MRN: [id number] Date of Birth: 10-02-1955

## 2019-03-11 ENCOUNTER — Encounter: Payer: Self-pay | Admitting: Occupational Therapy

## 2019-03-11 NOTE — Addendum Note (Signed)
Addended by: Ansel Bong on: 03/11/2019 05:17 PM   Modules accepted: Orders

## 2019-03-11 NOTE — Therapy (Deleted)
[facility] MAIN [facility] SERVICES [address] [place], [place], [address] Phone: [phone]   Fax:  [phone]  Occupational Therapy Evaluation  Patient Details  Name: Jaclyn ChacoDale Beissel MRN: [id number] Date of Birth: [birth year] Referring Provider (OT): Baltazar NajjarMichael Robson, MD   Encounter Date: 03/10/2019  OT End of Session - 03/10/19 1358    Visit Number  1    Number of Visits  36    Date for OT Re-Evaluation  06/08/19       History reviewed. No pertinent past medical history.  History reviewed. No pertinent surgical history.  There were no vitals filed for this visit.  Subjective Assessment - 03/10/19 1327    Subjective   Jaclyn ChacoDale Kramer is referred to Occupational Therapy for evaluation and treatment of RLE lymphedema  by Baltazar NajjarMichael Robson, MD. Pt reports swelling in R leg started some time ago , but got much worse about a month ago after an episode of cellulitis.  Pt is unable to identify any precipitating event that caused initial swelling. She does endorse family history of leg swelling in her father and in her two sisters. Pt reports two recent ultra sounds on her R leg performed during cellulitis episode were negative for DVT.Pt denies hx of leg wounds and ulcers.  She has not used compression stockings in the past because they hurt her legs.  Pt's stated goals are to get the swelling down in her legs and be pain free.    Patient is accompanied by:  Family member   mother, Jaclyn Kramer   Pertinent History  Hx anemia, HTN, anxiety disorder, major depressive disorder, hx non-adherance to medical treatment;    Limitations  limits ambulation, standing tolerance, transfers, bed mobility, ascending and descending stairs, difficulty fitting LB clothing and shoes. difficulty reaching feet (AROM) difficulty bathing    Repetition  Increases Symptoms    Special Tests  + Stemmer dorsal feet Lymphedema Life Impact Scale (LLIS) TBA next visit    Currently in Pain?   Yes    Pain Score  5     Pain Location  Leg    Pain Orientation  Right;Left    Pain Descriptors / Indicators  Aching;Pressure;Tender;Tightness;Heaviness;Sore;Tiring    Pain Type  Chronic pain    Pain Onset  More than a month ago    Pain Frequency  Intermittent    Aggravating Factors   standing, walking, dependent sitting > 20 minutes    Pain Relieving Factors  elevation, pain meds, rubbing    Effect of Pain on Daily Activities  BLE pain swelling and associated pain limits functional ambulation and mobility, transfers, basic ADLs (bathing, LB dressing, grooming), intrumental ADLs, ( home management tasks/ activities, role performace as caregiver to daughter and aging parents, ) limits productive and work activities requiring standing, wal;king or extended dependednt sitting, limits social participation in the community, including church, limits body image and self esteme 2/2 disfigurement                                OT Long Term Goals - 03/11/19 1654      OT LONG TERM GOAL #1   Title  Pt will apply BLE, knee length, multi-layer, short stretch compression wraps daily using correct gradient techniques with min caregiver assistance     to achieve optimal limb volume reduction, to return affected limb , as closely as possible, to premorbid size and shape, to  limit infection risk, and to improve safe functional ambulation and mobility.    Baseline  dependent    Time  4    Period  Days    Status  New    Target Date  --   4th OT Rx visit     OT LONG TERM GOAL #2   Title  Pt to achieve at least 10% BLE limb volume reductions below the knees during Intensive Phase CDT to increase  standing / walking tolerance , to increase functional performance of basic and instrumental ADLs, and to limit LE progression.    Baseline  dependent    Time  12    Period  Weeks    Status  New    Target Date  06/08/19      OT LONG TERM GOAL #3   Title  Pt will be able to verbalize signs  and symptoms of cellulitis infection and identify 4 common lymphedema precautions using printed resource for reference (modified independence) to limit LE progression over time .    Baseline  Max A    Time  4    Period  Days    Status  New    Target Date  --   4th OT Rx visit     OT LONG TERM GOAL #4   Title  Pt will achieve and sustain at least 85%  compliance with daily LE self-care home program (skin care, lymphatic pumping therex, compression and simple self-MLD) ) with min CG assistance during Intensive Phase CDT  to limit  limb swelling, reduce infection risk,  limit associated pain , and limit LE progression.    Baseline  Dependent    Time  12    Period  Weeks    Status  New    Target Date  06/08/19      OT LONG TERM GOAL #5   Title  Once issued Pt will be able to don and doff appropriate compression garments and/ or devices using correct techniques and assistive devices with extra time (modified independence) by end of Intensive Phase CDT for optimal LE management to limit progression over time.    Baseline  Max A    Time  12    Period  Weeks    Status  New    Target Date  06/08/19      Long Term Additional Goals   Additional Long Term Goals  Yes      OT LONG TERM GOAL #6   Title  During self-management phase of CDT Pt will retain limb volume reductions achieved during Intensive Phase CDT with no more than 3% volume increase to limit LE progression and further functional decline.    Baseline  Max A    Time  6    Period  Months    Status  New    Target Date  09/07/19            Plan - 03/11/19 1649    Clinical Impression Statement  Jaclyn Kramer is a 63 year-old female presenting with mild, stage III lymphedema (elephantiasisi) and moderate, stage II LLE lymphedema (LE).  Chronic, progressive leg swelling and associated pain has worsened over time. BLE LE limit's Pt's functional mobility, transfers and safe ambulation. It limits balance due to body asymmetry.  LE  limits Jaclyn Kramer' ability to perform functional activities in all occupational domains, including basic and instrumental ADLs, productive activities and leisure pursuits, social participation and role performance. Appearance of disfigured, swollen  legs limits  body image and self-esteem, clothing selection and participation in preferred exercise and social activities.  Jaclyn Kramer will benefit from skilled Occupational Therapy for Intensive and Management Phase Complete Decongestive Therapy (CDT) to include manual lymphatic drainage, skin care, therapeutic exercise and compression therapy. Emphasis throughout OT course will also focus on Pt education for long term LE self-care. Without skilled OT for CDT LE will progress resulting in progression of the condition, increased    infection risk and further functional decline. Pt will require ongoing caregiver assistance with LE home program , especially gradient compression wrapping. Without ongoing caregiver assistance during the Intensive Phase, prognosis for achieving OT goals is guarded.    OT Occupational Profile and History  Comprehensive Assessment- Review of records and extensive additional review of physical, cognitive, psychosocial history related to current functional performance    Occupational performance deficits (Please refer to evaluation for details):  ADL's;Leisure;IADL's;Social Participation;Work;Other   parenting role, self esteme, body image   Body Structure / Function / Physical Skills  ADL;Obesity;Decreased knowledge of precautions;Balance;Decreased knowledge of use of DME;Flexibility;IADL;Pain;Skin integrity;Edema;Mobility;ROM;Gait    Rehab Potential  Good    Clinical Decision Making  Several treatment options, min-mod task modification necessary    Comorbidities Affecting Occupational Performance:  Presence of comorbidities impacting occupational performance    Comorbidities impacting occupational performance description:  See  SUBJECTIVE    Modification or Assistance to Complete Evaluation   Min-Moderate modification of tasks or assist with assess necessary to complete eval    OT Frequency  2x / week    OT Duration  12 weeks   and PRN   OT Treatment/Interventions  Self-care/ADL training;Manual lymph drainage;Coping strategies training;Patient/family education;Compression bandaging;Therapist, nutritional;Therapeutic activities;Manual Therapy;DME and/or AE instruction;Other (comment)   myofacial release, skin care   Plan  treat one leg at a time to limit fall risk. Fit with custom compression garments bilaterally needed to achieve correct containment and fit. Consider fitting with 32-chamber, sequential pneumatic compression device (Tactile Medical Flexitouch "pump" ). The Flexitouch SPCD is the only sequential device that duplicates proximal to distal lymphatic decongestion to return interstitial fluid through inguinal and abdominal lymphatics to the heart. A basic pneumatic device is not appropriate for this patient because it does not treat deep abdominal lymphatics essential for transporting lymphatic congestion to the thoracic duct to return to circulation.       Patient will benefit from skilled therapeutic intervention in order to improve the following deficits and impairments:   Body Structure / Function / Physical Skills: ADL, Obesity, Decreased knowledge of precautions, Balance, Decreased knowledge of use of DME, Flexibility, IADL, Pain, Skin integrity, Edema, Mobility, ROM, Gait       Visit Diagnosis: Lymphedema, not elsewhere classified - Plan: Ot plan of care cert/re-cert    Problem List Patient Active Problem List   Diagnosis Date Noted  . 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 Anxiety state 11/28/2009  . Depressive disorder 11/28/2009  . Essential hypertension 11/28/2009    Ansel Bong 03/11/2019, 5:16 PM  [place] MAIN [facility] SERVICES [address]  [place], [place], [address] Phone: [phone]   Fax:  [phone]  Name: Jaclyn Kramer MRN: [id number] Date of Birth: [birth year]

## 2019-03-11 NOTE — Therapy (Signed)
[facility] MAIN [facility] SERVICES [address] [place], [place], [address] Phone: [phone]   Fax:  [phone]  Occupational Therapy Evaluation  Patient Details  Name: Katiria Calame MRN: [id number] Date of Birth: 08/26/1955 Referring Provider (OT): Baltazar Najjar, MD   Encounter Date: 03/10/2019  OT End of Session - 03/10/19 1358    Visit Number  1    Number of Visits  36    Date for OT Re-Evaluation  06/08/19       History reviewed. No pertinent past medical history.  History reviewed. No pertinent surgical history.  There were no vitals filed for this visit.  Subjective Assessment - 03/10/19 1327    Subjective   Peyten Punches is referred to Occupational Therapy for evaluation and treatment of RLE lymphedema  by Baltazar Najjar, MD. Pt reports swelling in R leg started some time ago , but got much worse about a month ago after an episode of cellulitis.  Pt is unable to identify any precipitating event that caused initial swelling. She does endorse family history of leg swelling in her father and in her two sisters. Pt reports two recent ultra sounds on her R leg performed during cellulitis episode were negative for DVT.Pt denies hx of leg wounds and ulcers.  She has not used compression stockings in the past because they hurt her legs.  Pt's stated goals are to get the swelling down in her legs and be pain free.    Patient is accompanied by:  Family member   mother, Saya Mccoll   Pertinent History  Hx anemia, HTN, anxiety disorder, major depressive disorder, hx non-adherance to medical treatment;    Limitations  limits ambulation, standing tolerance, transfers, bed mobility, ascending and descending stairs, difficulty fitting LB clothing and shoes. difficulty reaching feet (AROM) difficulty bathing    Repetition  Increases Symptoms    Special Tests  + Stemmer dorsal feet Lymphedema Life Impact Scale (LLIS) TBA next visit    Currently in Pain?   Yes    Pain Score  5     Pain Location  Leg    Pain Orientation  Right;Left    Pain Descriptors / Indicators  Aching;Pressure;Tender;Tightness;Heaviness;Sore;Tiring    Pain Type  Chronic pain    Pain Onset  More than a month ago    Pain Frequency  Intermittent    Aggravating Factors   standing, walking, dependent sitting > 20 minutes    Pain Relieving Factors  elevation, pain meds, rubbing    Effect of Pain on Daily Activities  BLE pain swelling and associated pain limits functional ambulation and mobility, transfers, basic ADLs (bathing, LB dressing, grooming), intrumental ADLs, ( home management tasks/ activities, role performace as caregiver to daughter and aging parents, ) limits productive and work activities requiring standing, wal;king or extended dependednt sitting, limits social participation in the community, including church, limits body image and self esteme 2/2 disfigurement        [facility] OT Assessment - 03/11/19 0001      Assessment   Medical Diagnosis  Mild, stage III , RLE lymphedema (elephantiasis); moderrate , stage II, LLE lymphedema. Suspect condition may be hereditary Lymphedema Tarda    Referring Provider (OT)  Baltazar Najjar, MD    Hand Dominance  Right    Prior Therapy  none      Precautions   Precaution Comments  LE skin precautions for cellulitis reoccurence and pre diabetes      Balance Screen  Has the patient fallen in the past 6 months  No    Has the patient had a decrease in activity level because of a fear of falling?   Yes      Home  Environment   Living Arrangements  Parent    Type of Home  House    Home Access  Stairs    Alternate Level Stairs - Number of Steps  1 story    Bathroom Shower/Tub  Tub/Shower unit;Curtain    Shower/tub characteristics  Curtain    Bathroom Toilet  Handicapped height    Bathroom Accessibility  No    Additional Comments  have tub bench , but not currently using    Lives With  Family   mother, father, son, 2 brothers      Prior Function   Level of Independence  Independent;Independent with household mobility without device;Independent with community mobility without device;Independent with transfers;Needs assistance with homemaking;Needs assistance with transfers;Needs assistance with ADLs    Vocation  Unemployed    Vocation Requirements  sedentary lifestyle    Leisure  family      IADL   Prior Level of Function Shopping  mod A    Shopping  Needs to be accompanied on any shopping trip    Prior Level of Function Light Housekeeping  mod A    Light Housekeeping  Launders small items, rinses stockings, etc.;Performs light daily tasks but cannot maintain acceptable level of cleanliness;Needs help with all home maintenance tasks    Prior Level of Function Meal Prep  supervision, seated    Meal Prep  Able to complete simple cold meal and snack prep      Activity Tolerance   Activity Tolerance  Endurance does not limit participation in activity    Activity Tolerance Comments  endurance does not limit activity tolerance. 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    Temperature Warm Cool wnl    x    Pitting Edema   1+ 2+ 3+ 4+ Non-pitting        x   Girth Symmetrical Asymmetrical Other Distribution    x R>L   Stemmer Sign Positive  Negative     BLE, strong    Lymphorrea History Of:  Present Absent    x     Wounds History Of Present Absent Venous Arterial Pressure Size     x          Signs of Infection Redness Warmth Erythema Acute Swelling Drainage Borders                   Scars   Adhesions Hypersensitivity  Sensation Light Touch Deep pressure Hypersensitivty   Present Impaired Present Impaired Absent Impaired   x  x     x  Nails WNL Fungus Other    x   x Hair Growth Symmetrical Asymmetrical    x   Skin Creases Base of toes  Ankles   Base of Fingers Medial Thighs              x x   x                           OT Long Term Goals - 03/11/19 1654      OT LONG TERM GOAL #1   Title  Pt will apply BLE, knee length, multi-layer, short stretch compression wraps daily using correct gradient techniques with min caregiver assistance     to achieve optimal limb volume reduction, to return affected limb , as closely as possible, to premorbid size and shape, to limit infection risk, and to improve safe functional ambulation and mobility.    Baseline  dependent    Time  4    Period  Days    Status  New    Target Date  --   4th OT Rx visit     OT LONG TERM GOAL #2   Title  Pt to achieve at least 10% BLE limb volume reductions below the knees during Intensive Phase CDT to increase  standing / walking tolerance , to increase functional performance of basic and instrumental ADLs, and to limit LE progression.    Baseline  dependent    Time  12    Period  Weeks    Status  New    Target Date  06/08/19      OT LONG TERM GOAL #3   Title  Pt will be able to verbalize signs and symptoms of cellulitis infection and identify 4 common lymphedema precautions using printed resource for reference (modified independence) to limit LE progression over time .    Baseline  Max A    Time  4    Period  Days    Status  New    Target Date  --   4th OT Rx visit     OT LONG TERM GOAL  #4   Title  Pt will achieve and sustain at least 85%  compliance with daily LE self-care home program (skin care, lymphatic pumping therex, compression and simple self-MLD) ) with min CG assistance during Intensive Phase CDT  to limit  limb swelling, reduce infection risk,  limit associated pain , and limit LE progression.    Baseline  Dependent    Time  12    Period  Weeks    Status  New    Target Date  06/08/19      OT LONG TERM GOAL #5   Title  Once issued Pt will be able to don and doff appropriate compression garments and/ or devices using correct techniques and assistive devices with extra time (modified independence) by end of Intensive Phase CDT for optimal LE management to limit progression over time.    Baseline  Max A    Time  12    Period  Weeks    Status  New    Target Date  06/08/19      Long Term Additional Goals   Additional Long Term Goals  Yes      OT LONG TERM GOAL #6   Title  During self-management phase of CDT Pt will retain limb volume reductions achieved during Intensive Phase CDT with no more than 3% volume increase to limit LE progression and further functional decline.    Baseline  Max A    Time  6    Period  Months    Status  New    Target Date  09/07/19            Plan - 03/11/19 1649    Clinical Impression Statement  Syvilla Martin is a 63 year-old female presenting with mild, stage III lymphedema (elephantiasisi) and moderate, stage II LLE lymphedema (LE).  Chronic, progressive leg swelling and associated pain has worsened over time. BLE LE limit's Pt's functional mobility, transfers and safe ambulation. It limits balance due to body asymmetry.  LE limits Ms. Delahunt' ability to perform functional activities in all occupational domains, including basic and instrumental ADLs, productive activities and leisure pursuits, social participation and role performance. Appearance of disfigured, swollen legs limits  body image and self-esteem, clothing selection  and participation in preferred exercise and social activities.  Ms. Osoria will benefit from skilled Occupational Therapy for Intensive and Management Phase Complete Decongestive Therapy (CDT) to include manual lymphatic drainage, skin care, therapeutic exercise and compression therapy. Emphasis throughout OT course will also focus on Pt education for long term LE self-care. Without skilled OT for CDT LE will progress resulting in progression of the condition, increased    infection risk and further functional decline. Pt will require ongoing caregiver assistance with LE home program , especially gradient compression wrapping. Without ongoing caregiver assistance during the Intensive Phase, prognosis for achieving OT goals is guarded.    OT Occupational Profile and History  Comprehensive Assessment- Review of records and extensive additional review of physical, cognitive, psychosocial history related to current functional performance    Occupational performance deficits (Please refer to evaluation for details):  ADL's;Leisure;IADL's;Social Participation;Work;Other   parenting role, self esteme, body image   Body Structure / Function / Physical Skills  ADL;Obesity;Decreased knowledge of precautions;Balance;Decreased knowledge of use of DME;Flexibility;IADL;Pain;Skin integrity;Edema;Mobility;ROM;Gait    Rehab Potential  Good    Clinical Decision Making  Several treatment options, min-mod task modification necessary    Comorbidities Affecting Occupational Performance:  Presence of comorbidities impacting occupational performance    Comorbidities impacting occupational performance description:  See SUBJECTIVE    Modification or Assistance to Complete Evaluation   Min-Moderate modification of tasks or assist with assess necessary to complete eval    OT Frequency  2x / week    OT Duration  12 weeks   and PRN   OT Treatment/Interventions  Self-care/ADL training;Manual lymph drainage;Coping strategies  training;Patient/family education;Compression bandaging;Therapist, nutritional;Therapeutic activities;Manual Therapy;DME and/or AE instruction;Other (comment)   myofacial release, skin care   Plan  treat one leg at a time to limit fall risk. Fit with custom compression garments bilaterally needed to achieve correct containment and fit. Consider fitting with 32-chamber, sequential pneumatic compression device (Tactile Medical Flexitouch "pump" ). The Flexitouch SPCD is the only sequential device that duplicates proximal to distal lymphatic decongestion to return interstitial fluid through inguinal and abdominal lymphatics to the heart. A basic pneumatic device is not appropriate for this patient because it does not treat deep abdominal lymphatics essential for transporting lymphatic congestion to the thoracic duct to return to circulation.       Patient will benefit from skilled therapeutic intervention in order to improve the following deficits and impairments:   Body Structure / Function /  Physical Skills: ADL, Obesity, Decreased knowledge of precautions, Balance, Decreased knowledge of use of DME, Flexibility, IADL, Pain, Skin integrity, Edema, Mobility, ROM, Gait       Visit Diagnosis: Lymphedema, not elsewhere classified - Plan: Ot plan of care cert/re-cert    Problem List Patient Active Problem List   Diagnosis Date Noted  . 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 Anxiety state 11/28/2009  . Depressive disorder 11/28/2009  . Essential hypertension 11/28/2009    Loel Dubonnet, MS, OTR/L, [facility] 03/11/19 5:19 PM  [facility] MAIN [facility] SERVICES [address] [place], [place], [address] Phone: [phone]   Fax:  [phone]  Name: Cambria Osten MRN: [id number] Date of Birth: 1955-12-23

## 2019-03-11 NOTE — Patient Instructions (Signed)

## 2019-03-17 ENCOUNTER — Ambulatory Visit: Payer: Self-pay | Attending: Internal Medicine | Admitting: Occupational Therapy

## 2019-03-17 ENCOUNTER — Other Ambulatory Visit: Payer: Self-pay

## 2019-03-17 DIAGNOSIS — I890 Lymphedema, not elsewhere classified: Secondary | ICD-10-CM | POA: Insufficient documentation

## 2019-03-17 NOTE — Therapy (Signed)
[facility] [address] [place], [place], [address] Phone: [phone]   Fax:  [phone]  Occupational Therapy Treatment  Patient Details  Name: Jaclyn Kramer MRN: [id number] Date of Birth: Mar 13, 1956 Referring Provider (OT): Baltazar NajjarMichael Robson, MD   Encounter Date: 03/17/2019  OT End of Session - 03/17/19 1344    Visit Number  2    Number of Visits  36    Date for OT Re-Evaluation  06/08/19    OT Start Time  0127    OT Stop Time  0245    OT Time Calculation (min)  78 min       No past medical history on file.  No past surgical history on file.  There were no vitals filed for this visit.  Subjective Assessment - 03/17/19 1327    Subjective   Jaclyn Kramer presents for OT visit 2/36 to address BLE lymphedema. Pt is unaccompanied today    Patient is accompanied by:  Family member   mother, Jaclyn ParkerWillie Kramer   Pertinent History  Hx anemia, HTN, anxiety disorder, major depressive disorder, hx non-adherance to medical treatment;    Limitations  limits ambulation, standing tolerance, transfers, bed mobility, ascending and descending stairs, difficulty fitting LB clothing and shoes. difficulty reaching feet (AROM) difficulty bathing    Repetition  Increases Symptoms    Special Tests  + Stemmer dorsal feet Lymphedema Life Impact Scale (LLIS) TBA next visit    Pain Onset  More than a month ago          LYMPHEDEMA/ONCOLOGY QUESTIONNAIRE - 03/17/19 1443      Lymphedema Assessments   Lymphedema Assessments  Lower extremities      Right Lower Extremity Lymphedema   Other  A-D= 6440.70 ml; E-D= 7209.60 ml; A-G= 13650.51 ml.    Other  LVD at A-D = 24.16%. LVD at E-G= 5.08%; lvg FOR a-g = 14.08%.       Left Lower Extremity Lymphedema   Other  LLE A-D= 4884.49 ml. LLE E-G= 6843.60 ml. LLE A-G= 11728.09 ml.              OT Treatments/Exercises (OP) - 03/17/19 0001      ADLs   ADL Education Given  Yes      Manual  Therapy   Manual Therapy  Edema management;Compression Bandaging    Manual therapy comments  baseline BLE comparative limb volumetrics    Compression Bandaging  LLE multilayer gradient compression wrap from base of toes to tibial tuberosity using 8, 10 and 12" short stretch wraps over single layer of 0.4 cm Rosidal foam and cotton stockinett. Circumferential wrap only today             OT Education - 03/17/19 1343    Education Details  Continued skilled Pt/caregiver education  And LE ADL training throughout visit for lymphedema self care/ home program, including compression wrapping, compression garment and device wear/care, lymphatic pumping ther ex, simple self-MLD, and skin care. Discussed progress towards goals.    Person(s) Educated  Patient    Methods  Explanation;Demonstration;Handout;Tactile cues;Verbal cues    Comprehension  Verbalized understanding;Returned demonstration;Verbal cues required;Tactile cues required;Need further instruction          OT Long Term Goals - 03/11/19 1654      OT LONG TERM GOAL #1   Title  Pt will apply BLE, knee length, multi-layer, short stretch compression wraps daily using correct gradient techniques with min caregiver assistance  to achieve optimal limb volume reduction, to return affected limb , as closely as possible, to premorbid size and shape, to limit infection risk, and to improve safe functional ambulation and mobility.    Baseline  dependent    Time  4    Period  Days    Status  New    Target Date  --   4th OT Rx visit     OT LONG TERM GOAL #2   Title  Pt to achieve at least 10% BLE limb volume reductions below the knees during Intensive Phase CDT to increase  standing / walking tolerance , to increase functional performance of basic and instrumental ADLs, and to limit LE progression.    Baseline  dependent    Time  12    Period  Weeks    Status  New    Target Date  06/08/19      OT LONG TERM GOAL #3   Title  Pt will be  able to verbalize signs and symptoms of cellulitis infection and identify 4 common lymphedema precautions using printed resource for reference (modified independence) to limit LE progression over time .    Baseline  Max A    Time  4    Period  Days    Status  New    Target Date  --   4th OT Rx visit     OT LONG TERM GOAL #4   Title  Pt will achieve and sustain at least 85%  compliance with daily LE self-care home program (skin care, lymphatic pumping therex, compression and simple self-MLD) ) with min CG assistance during Intensive Phase CDT  to limit  limb swelling, reduce infection risk,  limit associated pain , and limit LE progression.    Baseline  Dependent    Time  12    Period  Weeks    Status  New    Target Date  06/08/19      OT LONG TERM GOAL #5   Title  Once issued Pt will be able to don and doff appropriate compression garments and/ or devices using correct techniques and assistive devices with extra time (modified independence) by end of Intensive Phase CDT for optimal LE management to limit progression over time.    Baseline  Max A    Time  12    Period  Weeks    Status  New    Target Date  06/08/19      Long Term Additional Goals   Additional Long Term Goals  Yes      OT LONG TERM GOAL #6   Title  During self-management phase of CDT Pt will retain limb volume reductions achieved during Intensive Phase CDT with no more than 3% volume increase to limit LE progression and further functional decline.    Baseline  Max A    Time  6    Period  Months    Status  New    Target Date  09/07/19            Plan - 03/17/19 1452    Clinical Impression Statement  Completed baseline, BLE, comparative limb volumetrics revealing limb volume differential (LVD) for leg from ankle to tibial tuberosity(A-D)  measures 24.16%, L>R,thigh from knee to groin (E-G) measures 5.08%, L>R, and for full leg ankle to groin (A-G) measures 14.08%. These values show highest  volume is distributed  below the knee with thighs being relatively symmetrical. Three to four % greater limb  volume is typical  for dominant LE. Pt tolerated initial application of gradient compression wraps below the knee on the R without difficulty. Pt instructed to try to keep wraps in place for next 24 hours, but to remove them if she becomes uncomfortable. She verbalized plan to build tolerance to compression PR. Cont as per POC. Teach compression wrapping next visit.    OT Occupational Profile and History  Comprehensive Assessment- Review of records and extensive additional review of physical, cognitive, psychosocial history related to current functional performance    Occupational performance deficits (Please refer to evaluation for details):  ADL's;Leisure;IADL's;Social Participation;Work;Other   parenting role, self esteme, body image   Body Structure / Function / Physical Skills  ADL;Obesity;Decreased knowledge of precautions;Balance;Decreased knowledge of use of DME;Flexibility;IADL;Pain;Skin integrity;Edema;Mobility;ROM;Gait    Rehab Potential  Good    Clinical Decision Making  Several treatment options, min-mod task modification necessary    Comorbidities Affecting Occupational Performance:  Presence of comorbidities impacting occupational performance    Comorbidities impacting occupational performance description:  See SUBJECTIVE    Modification or Assistance to Complete Evaluation   Min-Moderate modification of tasks or assist with assess necessary to complete eval    OT Frequency  2x / week    OT Duration  12 weeks   and PRN   OT Treatment/Interventions  Self-care/ADL training;Manual lymph drainage;Coping strategies training;Patient/family education;Compression bandaging;Building services engineer;Therapeutic activities;Manual Therapy;DME and/or AE instruction;Other (comment)   myofacial release, skin care   Plan  treat one leg at a time to limit fall risk. Fit with custom compression garments bilaterally  needed to achieve correct containment and fit. Consider fitting with 32-chamber, sequential pneumatic compression device (Tactile Medical Flexitouch "pump" ). The Flexitouch SPCD is the only sequential device that duplicates proximal to distal lymphatic decongestion to return interstitial fluid through inguinal and abdominal lymphatics to the heart. A basic pneumatic device is not appropriate for this patient because it does not treat deep abdominal lymphatics essential for transporting lymphatic congestion to the thoracic duct to return to circulation.       Patient will benefit from skilled therapeutic intervention in order to improve the following deficits and impairments:   Body Structure / Function / Physical Skills: ADL, Obesity, Decreased knowledge of precautions, Balance, Decreased knowledge of use of DME, Flexibility, IADL, Pain, Skin integrity, Edema, Mobility, ROM, Gait       Visit Diagnosis: Lymphedema, not elsewhere classified    Problem List Patient Active Problem List   Diagnosis Date Noted  . 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 Anxiety state 11/28/2009  . Depressive disorder 11/28/2009  . Essential hypertension 11/28/2009    Loel Dubonnet, MS, OTR/L, [facility] 03/17/19 3:01 PM  [facility] MAIN [facility] SERVICES [address] [place], [place], [address] Phone: [phone]   Fax:  [phone]  Name: Jaclyn Kramer MRN: [id number] Date of Birth: [birth year]

## 2019-03-17 NOTE — Patient Instructions (Signed)

## 2019-03-19 ENCOUNTER — Ambulatory Visit: Payer: Self-pay | Admitting: Occupational Therapy

## 2019-03-19 ENCOUNTER — Other Ambulatory Visit: Payer: Self-pay

## 2019-03-19 DIAGNOSIS — I890 Lymphedema, not elsewhere classified: Secondary | ICD-10-CM

## 2019-03-19 NOTE — Therapy (Signed)
[place] MAIN [facility] SERVICES [address] [place], [place], [address] Phone: [phone]   Fax:  [phone]  Occupational Therapy Treatment  Patient Details  Name: Jaclyn Kramer MRN: [id number] Date of Birth: [birth year] Referring Provider (OT): Linton Ham, MD   Encounter Date: 03/19/2019  OT End of Session - 03/19/19 1123    Visit Number  3    Number of Visits  36    Date for OT Re-Evaluation  06/08/19    OT Start Time  1696       No past medical history on file.  No past surgical history on file.  There were no vitals filed for this visit.  Subjective Assessment - 03/19/19 1122    Subjective   Jaclyn Kramer presents for OT visit 3/36 to address BLE lymphedema. Pt is 25 minutes late for a 60 minute session. Pt presents with RLE gradient wraps in plqace. Pt reports she had no difficulty tolerating wraps during Rx interval. Pt is unaccompanied today    Patient is accompanied by:  Family member   mother, Jaclyn Kramer   Pertinent History  Hx anemia, HTN, anxiety disorder, major depressive disorder, hx non-adherance to medical treatment;    Limitations  limits ambulation, standing tolerance, transfers, bed mobility, ascending and descending stairs, difficulty fitting LB clothing and shoes. difficulty reaching feet (AROM) difficulty bathing    Repetition  Increases Symptoms    Special Tests  + Stemmer dorsal feet Lymphedema Life Impact Scale (LLIS) TBA next visit    Pain Onset  More than a month ago                   OT Treatments/Exercises (OP) - 03/19/19 0001      ADLs   ADL Education Given  Yes      Manual Therapy   Manual Therapy  Compression Bandaging;Edema management    Edema Management  BLE lymphatic pumping therex    Compression Bandaging  LLE multilayer gradient compression wrap from base of toes to tibial tuberosity using 8, 10 and 12" short stretch wraps over single layer of 0.4 cm Rosidal foam and cotton  stockinett. Circumferential wrap only today             OT Education - 03/19/19 1123    Education Details  Pt edu for lymphatic pumping therex    Person(s) Educated  Patient    Methods  Explanation;Demonstration;Handout;Tactile cues;Verbal cues    Comprehension  Verbalized understanding;Returned demonstration;Verbal cues required;Tactile cues required;Need further instruction          OT Long Term Goals - 03/11/19 1654      OT LONG TERM GOAL #1   Title  Pt will apply BLE, knee length, multi-layer, short stretch compression wraps daily using correct gradient techniques with min caregiver assistance     to achieve optimal limb volume reduction, to return affected limb , as closely as possible, to premorbid size and shape, to limit infection risk, and to improve safe functional ambulation and mobility.    Baseline  dependent    Time  4    Period  Days    Status  New    Target Date  --   4th OT Rx visit     OT LONG TERM GOAL #2   Title  Pt to achieve at least 10% BLE limb volume reductions below the knees during Intensive Phase CDT to increase  standing / walking tolerance , to increase functional performance of  basic and instrumental ADLs, and to limit LE progression.    Baseline  dependent    Time  12    Period  Weeks    Status  New    Target Date  06/08/19      OT LONG TERM GOAL #3   Title  Pt will be able to verbalize signs and symptoms of cellulitis infection and identify 4 common lymphedema precautions using printed resource for reference (modified independence) to limit LE progression over time .    Baseline  Max A    Time  4    Period  Days    Status  New    Target Date  --   4th OT Rx visit     OT LONG TERM GOAL #4   Title  Pt will achieve and sustain at least 85%  compliance with daily LE self-care home program (skin care, lymphatic pumping therex, compression and simple self-MLD) ) with min CG assistance during Intensive Phase CDT  to limit  limb swelling,  reduce infection risk,  limit associated pain , and limit LE progression.    Baseline  Dependent    Time  12    Period  Weeks    Status  New    Target Date  06/08/19      OT LONG TERM GOAL #5   Title  Once issued Pt will be able to don and doff appropriate compression garments and/ or devices using correct techniques and assistive devices with extra time (modified independence) by end of Intensive Phase CDT for optimal LE management to limit progression over time.    Baseline  Max A    Time  12    Period  Weeks    Status  New    Target Date  06/08/19      Long Term Additional Goals   Additional Long Term Goals  Yes      OT LONG TERM GOAL #6   Title  During self-management phase of CDT Pt will retain limb volume reductions achieved during Intensive Phase CDT with no more than 3% volume increase to limit LE progression and further functional decline.    Baseline  Max A    Time  6    Period  Months    Status  New    Target Date  09/07/19            Plan - 03/19/19 1157    Clinical Impression Statement  RLE limb volume mildly reduced today after initial application of gradient compression wraps. Pt left wraps in place since initially applied 2 days ago. No signs of skin irritation or infection. After skilled teaching for ther ex Pt able to complete lymphatic pumping with min A. Reapplied cmopression wrap as established. Cont as per POC. Review ther ex next week to ensure good form and optimal independence w LE self care.    OT Occupational Profile and History  Comprehensive Assessment- Review of records and extensive additional review of physical, cognitive, psychosocial history related to current functional performance    Occupational performance deficits (Please refer to evaluation for details):  ADL's;Leisure;IADL's;Social Participation;Work;Other   parenting role, self esteme, body image   Body Structure / Function / Physical Skills  ADL;Obesity;Decreased knowledge of  precautions;Balance;Decreased knowledge of use of DME;Flexibility;IADL;Pain;Skin integrity;Edema;Mobility;ROM;Gait    Rehab Potential  Good    Clinical Decision Making  Several treatment options, min-mod task modification necessary    Comorbidities Affecting Occupational Performance:  Presence of comorbidities  impacting occupational performance    Comorbidities impacting occupational performance description:  See SUBJECTIVE    Modification or Assistance to Complete Evaluation   Min-Moderate modification of tasks or assist with assess necessary to complete eval    OT Frequency  2x / week    OT Duration  12 weeks   and PRN   OT Treatment/Interventions  Self-care/ADL training;Manual lymph drainage;Coping strategies training;Patient/family education;Compression bandaging;Building services engineerunctional Mobility Training;Therapeutic activities;Manual Therapy;DME and/or AE instruction;Other (comment)   myofacial release, skin care   Plan  treat one leg at a time to limit fall risk. Fit with custom compression garments bilaterally needed to achieve correct containment and fit. Consider fitting with 32-chamber, sequential pneumatic compression device (Tactile Medical Flexitouch "pump" ). The Flexitouch SPCD is the only sequential device that duplicates proximal to distal lymphatic decongestion to return interstitial fluid through inguinal and abdominal lymphatics to the heart. A basic pneumatic device is not appropriate for this patient because it does not treat deep abdominal lymphatics essential for transporting lymphatic congestion to the thoracic duct to return to circulation.       Patient will benefit from skilled therapeutic intervention in order to improve the following deficits and impairments:   Body Structure / Function / Physical Skills: ADL, Obesity, Decreased knowledge of precautions, Balance, Decreased knowledge of use of DME, Flexibility, IADL, Pain, Skin integrity, Edema, Mobility, ROM, Gait       Visit  Diagnosis: Lymphedema, not elsewhere classified    Problem List Patient Active Problem List   Diagnosis Date Noted  . 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 Anxiety state 11/28/2009  . Depressive disorder 11/28/2009  . Essential hypertension 11/28/2009    Loel Dubonnetheresa Shelvy Perazzo, MS, OTR/L, [facility] 03/19/19 12:04 PM    [place] [facility] [address] [place], [place], [address] Phone: [phone]   Fax:  [phone]  Name: Jaclyn ChacoDale Kramer MRN: [id number] Date of Birth: 1955-12-21

## 2019-03-23 ENCOUNTER — Other Ambulatory Visit: Payer: Self-pay

## 2019-03-23 ENCOUNTER — Ambulatory Visit: Payer: Self-pay | Admitting: Occupational Therapy

## 2019-03-23 DIAGNOSIS — I890 Lymphedema, not elsewhere classified: Secondary | ICD-10-CM

## 2019-03-23 NOTE — Therapy (Signed)
[place] MAIN [facility] SERVICES [address] [place], [place], [address] Phone: [phone]   Fax:  [phone]  Occupational Therapy Treatment  Patient Details  Name: Jaclyn Kramer MRN: [id number] Date of Birth: 1955/07/21 Referring Provider (OT): Linton Ham, MD   Encounter Date: 03/23/2019  OT End of Session - 03/23/19 1616    Visit Number  4    Number of Visits  36    Date for OT Re-Evaluation  06/08/19    OT Start Time  0310    OT Stop Time  0420    OT Time Calculation (min)  70 min       No past medical history on file.  No past surgical history on file.  There were no vitals filed for this visit.  Subjective Assessment - 03/23/19 1517    Subjective   Jaclyn Kramer presents for OT visit 4/36 to address BLE lymphedema. Pt presents with compression bandages that were applied at last visit  in place. Pt c/o soreness at the shin area and itching beneath the wraps.Pt encouraged to remove bandages daily, inspect and bathe skin, apply low ph lotion and rewrap, and not to leave wraps on for more than 24 hours.    Patient is accompanied by:  Family member   mother, Jaclyn Kramer   Pertinent History  Hx anemia, HTN, anxiety disorder, major depressive disorder, hx non-adherance to medical treatment;    Limitations  limits ambulation, standing tolerance, transfers, bed mobility, ascending and descending stairs, difficulty fitting LB clothing and shoes. difficulty reaching feet (AROM) difficulty bathing    Repetition  Increases Symptoms    Special Tests  + Stemmer dorsal feet Lymphedema Life Impact Scale (LLIS) TBA next visit    Pain Onset  More than a month ago                   OT Treatments/Exercises (OP) - 03/23/19 0001      ADLs   ADL Education Given  Yes      Manual Therapy   Manual therapy comments  skin care below knee on LLE    Compression Bandaging  LLE multilayer gradient compression wrap from base of toes to  tibial tuberosity using 8, 10 and 12" short stretch wraps over single layer of 0.4 cm Rosidal foam and cotton stockinett. Circumferential wrap only today             OT Education - 03/23/19 1623    Education Details  Skilled Pt edu/ training for LE self care home program re daily skin care , bathing and low ph lotion, and gradient        , knee length compression wrapping w short stretch wraps.    Person(s) Educated  Patient    Methods  Explanation;Demonstration;Handout;Tactile cues;Verbal cues    Comprehension  Verbalized understanding;Returned demonstration;Verbal cues required;Tactile cues required;Need further instruction          OT Long Term Goals - 03/11/19 1654      OT LONG TERM GOAL #1   Title  Pt will apply BLE, knee length, multi-layer, short stretch compression wraps daily using correct gradient techniques with min caregiver assistance     to achieve optimal limb volume reduction, to return affected limb , as closely as possible, to premorbid size and shape, to limit infection risk, and to improve safe functional ambulation and mobility.    Baseline  dependent    Time  4    Period  Days    Status  New    Target Date  --   4th OT Rx visit     OT LONG TERM GOAL #2   Title  Pt to achieve at least 10% BLE limb volume reductions below the knees during Intensive Phase CDT to increase  standing / walking tolerance , to increase functional performance of basic and instrumental ADLs, and to limit LE progression.    Baseline  dependent    Time  12    Period  Weeks    Status  New    Target Date  06/08/19      OT LONG TERM GOAL #3   Title  Pt will be able to verbalize signs and symptoms of cellulitis infection and identify 4 common lymphedema precautions using printed resource for reference (modified independence) to limit LE progression over time .    Baseline  Max A    Time  4    Period  Days    Status  New    Target Date  --   4th OT Rx visit     OT LONG TERM GOAL  #4   Title  Pt will achieve and sustain at least 85%  compliance with daily LE self-care home program (skin care, lymphatic pumping therex, compression and simple self-MLD) ) with min CG assistance during Intensive Phase CDT  to limit  limb swelling, reduce infection risk,  limit associated pain , and limit LE progression.    Baseline  Dependent    Time  12    Period  Weeks    Status  New    Target Date  06/08/19      OT LONG TERM GOAL #5   Title  Once issued Pt will be able to don and doff appropriate compression garments and/ or devices using correct techniques and assistive devices with extra time (modified independence) by end of Intensive Phase CDT for optimal LE management to limit progression over time.    Baseline  Max A    Time  12    Period  Weeks    Status  New    Target Date  06/08/19      Long Term Additional Goals   Additional Long Term Goals  Yes      OT LONG TERM GOAL #6   Title  During self-management phase of CDT Pt will retain limb volume reductions achieved during Intensive Phase CDT with no more than 3% volume increase to limit LE progression and further functional decline.    Baseline  Max A    Time  6    Period  Months    Status  New    Target Date  09/07/19            Plan - 03/23/19 1626    Clinical Impression Statement  Pt demonstrated excellent response to OT Rx today evidenced by excellent tolerance for gradient compression wraps between sessions, decreased limb volume below the knee in LLE, improved tissue gydration , and ability to apply short stretch wraps using correct techniques to achieve distal to proximal gradient after skilled teaching with max A. Cont as per POC.    OT Occupational Profile and History  Comprehensive Assessment- Review of records and extensive additional review of physical, cognitive, psychosocial history related to current functional performance    Occupational performance deficits (Please refer to evaluation for details):   ADL's;Leisure;IADL's;Social Participation;Work;Other   parenting role, self esteme, body image   Body Structure /  Function / Physical Skills  ADL;Obesity;Decreased knowledge of precautions;Balance;Decreased knowledge of use of DME;Flexibility;IADL;Pain;Skin integrity;Edema;Mobility;ROM;Gait    Rehab Potential  Good    Clinical Decision Making  Several treatment options, min-mod task modification necessary    Comorbidities Affecting Occupational Performance:  Presence of comorbidities impacting occupational performance    Comorbidities impacting occupational performance description:  See SUBJECTIVE    Modification or Assistance to Complete Evaluation   Min-Moderate modification of tasks or assist with assess necessary to complete eval    OT Frequency  2x / week    OT Duration  12 weeks   and PRN   OT Treatment/Interventions  Self-care/ADL training;Manual lymph drainage;Coping strategies training;Patient/family education;Compression bandaging;Building services engineerunctional Mobility Training;Therapeutic activities;Manual Therapy;DME and/or AE instruction;Other (comment)   myofacial release, skin care   Plan  treat one leg at a time to limit fall risk. Fit with custom compression garments bilaterally needed to achieve correct containment and fit. Consider fitting with 32-chamber, sequential pneumatic compression device (Tactile Medical Flexitouch "pump" ). The Flexitouch SPCD is the only sequential device that duplicates proximal to distal lymphatic decongestion to return interstitial fluid through inguinal and abdominal lymphatics to the heart. A basic pneumatic device is not appropriate for this patient because it does not treat deep abdominal lymphatics essential for transporting lymphatic congestion to the thoracic duct to return to circulation.       Patient will benefit from skilled therapeutic intervention in order to improve the following deficits and impairments:   Body Structure / Function / Physical Skills:  ADL, Obesity, Decreased knowledge of precautions, Balance, Decreased knowledge of use of DME, Flexibility, IADL, Pain, Skin integrity, Edema, Mobility, ROM, Gait       Visit Diagnosis: Lymphedema, not elsewhere classified    Problem List Patient Active Problem List   Diagnosis Date Noted  . 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 Anxiety state 11/28/2009  . Depressive disorder 11/28/2009  . Essential hypertension 11/28/2009    Jaclyn Dubonnetheresa Britteny Fiebelkorn, MS, OTR/L, [facility] 03/23/19 4:28 PM   [place] [facility] [address] [place], [place], [address] Phone: [phone]   Fax:  [phone]  Name: Jaclyn Kramer MRN: [id number] Date of Birth: May 26, 1956

## 2019-03-25 ENCOUNTER — Ambulatory Visit: Payer: Self-pay | Admitting: Occupational Therapy

## 2019-03-25 ENCOUNTER — Other Ambulatory Visit: Payer: Self-pay

## 2019-03-25 DIAGNOSIS — I890 Lymphedema, not elsewhere classified: Secondary | ICD-10-CM

## 2019-03-25 NOTE — Therapy (Signed)
[facility] MAIN [facility] SERVICES [address] [place], [place], [address] Phone: [phone]   Fax:  [phone]  Occupational Therapy Treatment  Patient Details  Name: Jaclyn Kramer MRN: [id number] Date of Birth: 10-06-1955 Referring Provider (OT): Baltazar Najjar, MD   Encounter Date: 03/25/2019  OT End of Session - 03/25/19 1204    Visit Number  5    Number of Visits  36    Date for OT Re-Evaluation  06/08/19    OT Start Time  1120    OT Stop Time  1212    OT Time Calculation (min)  52 min       No past medical history on file.  No past surgical history on file.  There were no vitals filed for this visit.  Subjective Assessment - 03/25/19 1122    Subjective   Jaclyn Kramer presents for OT visit 5/36 to address BLE lymphedema. Pt presents with compression bandages that were applied at last visit  in place. Pt 16 min late for 60 min appoitment. Pt reports soreness in RLE is reduced to 2.    Patient is accompanied by:  Family member   mother, Jaclyn Kramer   Pertinent History  Hx anemia, HTN, anxiety disorder, major depressive disorder, hx non-adherance to medical treatment;    Limitations  limits ambulation, standing tolerance, transfers, bed mobility, ascending and descending stairs, difficulty fitting LB clothing and shoes. difficulty reaching feet (AROM) difficulty bathing    Repetition  Increases Symptoms    Special Tests  + Stemmer dorsal feet Lymphedema Life Impact Scale (LLIS) TBA next visit    Pain Onset  More than a month ago                   OT Treatments/Exercises (OP) - 03/25/19 0001      ADLs   ADL Education Given  Yes      Manual Therapy   Manual Therapy  Edema management;Manual Lymphatic Drainage (MLD)    Manual therapy comments  skin care below knee on LLE    Manual Lymphatic Drainage (MLD)  MLD to RLE/ RLQ utilizing short neck  sequence, deep  abdominal pathways and functional inguinal LN.    Compression Bandaging  LLE multilayer gradient compression wrap from base of toes to tibial tuberosity using 8, 10 and 12" short stretch wraps over single layer of 0.4 cm Rosidal foam and cotton stockinett. Circumferential wrap only today             OT Education - 03/25/19 1203    Education Details  Pt edu - intro level for simple self MLD    Person(s) Educated  Patient    Methods  Explanation;Demonstration;Handout;Tactile cues;Verbal cues    Comprehension  Verbalized understanding;Returned demonstration;Verbal cues required;Tactile cues required;Need further instruction          OT Long Term Goals - 03/11/19 1654      OT LONG TERM GOAL #1   Title  Pt will apply BLE, knee length, multi-layer, short stretch compression wraps daily using correct gradient techniques with min caregiver assistance     to achieve optimal limb volume reduction, to return affected limb , as closely as possible, to premorbid size and shape, to limit infection risk, and to improve safe functional ambulation and mobility.    Baseline  dependent    Time  4    Period  Days    Status  New    Target Date  --  4th OT Rx visit     OT LONG TERM GOAL #2   Title  Pt to achieve at least 10% BLE limb volume reductions below the knees during Intensive Phase CDT to increase  standing / walking tolerance , to increase functional performance of basic and instrumental ADLs, and to limit LE progression.    Baseline  dependent    Time  12    Period  Weeks    Status  New    Target Date  06/08/19      OT LONG TERM GOAL #3   Title  Pt will be able to verbalize signs and symptoms of cellulitis infection and identify 4 common lymphedema precautions using printed resource for reference (modified independence) to limit LE progression over time .    Baseline  Max A    Time  4    Period  Days    Status  New    Target Date  --   4th OT Rx visit     OT LONG TERM GOAL #4   Title  Pt will achieve and sustain at least 85%   compliance with daily LE self-care home program (skin care, lymphatic pumping therex, compression and simple self-MLD) ) with min CG assistance during Intensive Phase CDT  to limit  limb swelling, reduce infection risk,  limit associated pain , and limit LE progression.    Baseline  Dependent    Time  12    Period  Weeks    Status  New    Target Date  06/08/19      OT LONG TERM GOAL #5   Title  Once issued Pt will be able to don and doff appropriate compression garments and/ or devices using correct techniques and assistive devices with extra time (modified independence) by end of Intensive Phase CDT for optimal LE management to limit progression over time.    Baseline  Max A    Time  12    Period  Weeks    Status  New    Target Date  06/08/19      Long Term Additional Goals   Additional Long Term Goals  Yes      OT LONG TERM GOAL #6   Title  During self-management phase of CDT Pt will retain limb volume reductions achieved during Intensive Phase CDT with no more than 3% volume increase to limit LE progression and further functional decline.    Baseline  Max A    Time  6    Period  Months    Status  New    Target Date  09/07/19            Plan - 03/25/19 1204    Clinical Impression Statement  Commenced MLD to RLE today. Pt verbalized understanding of anatomy and sequencing.Skin below the knee, anteriorly and posteriorly is dense and indurated, and consequently difficult to mobiliize during MLD. Skin care throughout MLD w/ low ph castor oil  employed to improve skin hydration and mobility. Cont as per POC. Pt encouraged to arrive on time to maximize clinical Rx time.    OT Occupational Profile and History  Comprehensive Assessment- Review of records and extensive additional review of physical, cognitive, psychosocial history related to current functional performance    Occupational performance deficits (Please refer to evaluation for details):  ADL's;Leisure;IADL's;Social  Participation;Work;Other   parenting role, self esteme, body image   Body Structure / Function / Physical Skills  ADL;Obesity;Decreased knowledge of precautions;Balance;Decreased knowledge  of use of DME;Flexibility;IADL;Pain;Skin integrity;Edema;Mobility;ROM;Gait    Rehab Potential  Good    Clinical Decision Making  Several treatment options, min-mod task modification necessary    Comorbidities Affecting Occupational Performance:  Presence of comorbidities impacting occupational performance    Comorbidities impacting occupational performance description:  See SUBJECTIVE    Modification or Assistance to Complete Evaluation   Min-Moderate modification of tasks or assist with assess necessary to complete eval    OT Frequency  2x / week    OT Duration  12 weeks   and PRN   OT Treatment/Interventions  Self-care/ADL training;Manual lymph drainage;Coping strategies training;Patient/family education;Compression bandaging;Building services engineerunctional Mobility Training;Therapeutic activities;Manual Therapy;DME and/or AE instruction;Other (comment)   myofacial release, skin care   Plan  treat one leg at a time to limit fall risk. Fit with custom compression garments bilaterally needed to achieve correct containment and fit. Consider fitting with 32-chamber, sequential pneumatic compression device (Tactile Medical Flexitouch "pump" ). The Flexitouch SPCD is the only sequential device that duplicates proximal to distal lymphatic decongestion to return interstitial fluid through inguinal and abdominal lymphatics to the heart. A basic pneumatic device is not appropriate for this patient because it does not treat deep abdominal lymphatics essential for transporting lymphatic congestion to the thoracic duct to return to circulation.       Patient will benefit from skilled therapeutic intervention in order to improve the following deficits and impairments:   Body Structure / Function / Physical Skills: ADL, Obesity, Decreased  knowledge of precautions, Balance, Decreased knowledge of use of DME, Flexibility, IADL, Pain, Skin integrity, Edema, Mobility, ROM, Gait       Visit Diagnosis: Lymphedema, not elsewhere classified    Problem List Patient Active Problem List   Diagnosis Date Noted  . 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 Anxiety state 11/28/2009  . Depressive disorder 11/28/2009  . Essential hypertension 11/28/2009    Loel Dubonnetheresa Gilliam, MS, OTR/L, [facility] 03/25/19 12:17 PM  [place] [facility] [address] [place], [place], [address] Phone: [phone]   Fax:  [phone]  Name: Jaclyn Kramer MRN: [id number] Date of Birth: 18-Sep-1955

## 2019-03-26 ENCOUNTER — Ambulatory Visit: Payer: Self-pay | Admitting: Occupational Therapy

## 2019-03-29 ENCOUNTER — Other Ambulatory Visit: Payer: Self-pay

## 2019-03-29 ENCOUNTER — Ambulatory Visit: Payer: Self-pay | Admitting: Occupational Therapy

## 2019-03-29 DIAGNOSIS — I890 Lymphedema, not elsewhere classified: Secondary | ICD-10-CM

## 2019-03-29 NOTE — Therapy (Signed)
[place] [facility] MAIN [facility] SERVICES [address] [place], [place], [address] Phone: [phone]   Fax:  [phone]  Occupational Therapy Treatment  Patient Details  Name: Jaclyn Kramer MRN: [id number] Date of Birth: [birth year] Referring Provider (OT): Baltazar Najjar, MD   Encounter Date: 03/29/2019  OT End of Session - 03/29/19 1339    Visit Number  6    Number of Visits  36    Date for OT Re-Evaluation  06/08/19    OT Start Time  0120    OT Stop Time  0210    OT Time Calculation (min)  50 min       No past medical history on file.  No past surgical history on file.  There were no vitals filed for this visit.  Subjective Assessment - 03/29/19 1324    Subjective   Jaclyn Kramer presents for OT visit 6/36 to address BLE lymphedema. Pt presents with compression bandages that were applied at last visit  in place. Pt 20 min late for 60 min appoitment. Pt denies pain today.    Patient is accompanied by:  Family member   mother, Jaclyn Kramer   Pertinent History  Hx anemia, HTN, anxiety disorder, major depressive disorder, hx non-adherance to medical treatment;    Limitations  limits ambulation, standing tolerance, transfers, bed mobility, ascending and descending stairs, difficulty fitting LB clothing and shoes. difficulty reaching feet (AROM) difficulty bathing    Repetition  Increases Symptoms    Special Tests  + Stemmer dorsal feet Lymphedema Life Impact Scale (LLIS) TBA next visit    Pain Onset  More than a month ago                   OT Treatments/Exercises (OP) - 03/29/19 0001      ADLs   ADL Education Given  Yes      Manual Therapy   Manual Therapy  Edema management;Manual Lymphatic Drainage (MLD)    Manual therapy comments  skin care below knee on LLE    Manual Lymphatic Drainage (MLD)  MLD to RLE/ RLQ utilizing short neck  sequence, deep  abdominal pathways and functional inguinal LN.    Compression Bandaging  LLE  multilayer gradient compression wrap from base of toes to tibial tuberosity using 8, 10 and 12" short stretch wraps over single layer of 0.4 cm Rosidal foam and cotton stockinett. Circumferential wrap only today             OT Education - 03/29/19 1337    Education Details  Resumed Pt edu for lymphedema self care compression wraping to ensure Pt removed wraps daily, inspects and bathes skin and is able to reapply them correctly between visits to ensure safety and optimal swelling reduction.    Person(s) Educated  Patient    Methods  Explanation;Demonstration;Handout;Tactile cues;Verbal cues    Comprehension  Verbalized understanding;Returned demonstration;Verbal cues required;Tactile cues required;Need further instruction          OT Long Term Goals - 03/11/19 1654      OT LONG TERM GOAL #1   Title  Pt will apply BLE, knee length, multi-layer, short stretch compression wraps daily using correct gradient techniques with min caregiver assistance     to achieve optimal limb volume reduction, to return affected limb , as closely as possible, to premorbid size and shape, to limit infection risk, and to improve safe functional ambulation and mobility.    Baseline  dependent    Time  4    Period  Days    Status  New    Target Date  --   4th OT Rx visit     OT LONG TERM GOAL #2   Title  Pt to achieve at least 10% BLE limb volume reductions below the knees during Intensive Phase CDT to increase  standing / walking tolerance , to increase functional performance of basic and instrumental ADLs, and to limit LE progression.    Baseline  dependent    Time  12    Period  Weeks    Status  New    Target Date  06/08/19      OT LONG TERM GOAL #3   Title  Pt will be able to verbalize signs and symptoms of cellulitis infection and identify 4 common lymphedema precautions using printed resource for reference (modified independence) to limit LE progression over time .    Baseline  Max A    Time   4    Period  Days    Status  New    Target Date  --   4th OT Rx visit     OT LONG TERM GOAL #4   Title  Pt will achieve and sustain at least 85%  compliance with daily LE self-care home program (skin care, lymphatic pumping therex, compression and simple self-MLD) ) with min CG assistance during Intensive Phase CDT  to limit  limb swelling, reduce infection risk,  limit associated pain , and limit LE progression.    Baseline  Dependent    Time  12    Period  Weeks    Status  New    Target Date  06/08/19      OT LONG TERM GOAL #5   Title  Once issued Pt will be able to don and doff appropriate compression garments and/ or devices using correct techniques and assistive devices with extra time (modified independence) by end of Intensive Phase CDT for optimal LE management to limit progression over time.    Baseline  Max A    Time  12    Period  Weeks    Status  New    Target Date  06/08/19      Long Term Additional Goals   Additional Long Term Goals  Yes      OT LONG TERM GOAL #6   Title  During self-management phase of CDT Pt will retain limb volume reductions achieved during Intensive Phase CDT with no more than 3% volume increase to limit LE progression and further functional decline.    Baseline  Max A    Time  6    Period  Months    Status  New    Target Date  09/07/19            Plan - 03/29/19 1403    Clinical Impression Statement  Removed wraps and provided skin care. Provided Pt edu again for compression wrapping using gradient techniques. By end of session Pt ableto apply wraps using min A. Pt enthusiastically encouraged to remove wraps, bathe and inspect skin, perform skin care, and reapply wraps tomorrow. Resume MLD once Pt has mastered compression wrapping.    OT Occupational Profile and History  Comprehensive Assessment- Review of records and extensive additional review of physical, cognitive, psychosocial history related to current functional performance     Occupational performance deficits (Please refer to evaluation for details):  ADL's;Leisure;IADL's;Social Participation;Work;Other   parenting role, self esteme, body image  Body Structure / Function / Physical Skills  ADL;Obesity;Decreased knowledge of precautions;Balance;Decreased knowledge of use of DME;Flexibility;IADL;Pain;Skin integrity;Edema;Mobility;ROM;Gait    Rehab Potential  Good    Clinical Decision Making  Several treatment options, min-mod task modification necessary    Comorbidities Affecting Occupational Performance:  Presence of comorbidities impacting occupational performance    Comorbidities impacting occupational performance description:  See SUBJECTIVE    Modification or Assistance to Complete Evaluation   Min-Moderate modification of tasks or assist with assess necessary to complete eval    OT Frequency  2x / week    OT Duration  12 weeks   and PRN   OT Treatment/Interventions  Self-care/ADL training;Manual lymph drainage;Coping strategies training;Patient/family education;Compression bandaging;Therapist, nutritional;Therapeutic activities;Manual Therapy;DME and/or AE instruction;Other (comment)   myofacial release, skin care   Plan  treat one leg at a time to limit fall risk. Fit with custom compression garments bilaterally needed to achieve correct containment and fit. Consider fitting with 32-chamber, sequential pneumatic compression device (Tactile Medical Flexitouch "pump" ). The Flexitouch SPCD is the only sequential device that duplicates proximal to distal lymphatic decongestion to return interstitial fluid through inguinal and abdominal lymphatics to the heart. A basic pneumatic device is not appropriate for this patient because it does not treat deep abdominal lymphatics essential for transporting lymphatic congestion to the thoracic duct to return to circulation.       Patient will benefit from skilled therapeutic intervention in order to improve the following  deficits and impairments:   Body Structure / Function / Physical Skills: ADL, Obesity, Decreased knowledge of precautions, Balance, Decreased knowledge of use of DME, Flexibility, IADL, Pain, Skin integrity, Edema, Mobility, ROM, Gait       Visit Diagnosis: Lymphedema, not elsewhere classified    Problem List Patient Active Problem List   Diagnosis Date Noted  . 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 Anxiety state 11/28/2009  . Depressive disorder 11/28/2009  . Essential hypertension 11/28/2009    Andrey Spearman, MS, OTR/L, [facility] 03/29/19 3:16 PM  [place] MAIN [facility] SERVICES [address] [place], [place], [address] Phone: [phone]   Fax:  [phone]  Name: Jaclyn Kramer MRN: [id number] Date of Birth: 03/06/1956

## 2019-03-31 ENCOUNTER — Ambulatory Visit: Payer: Self-pay | Admitting: Occupational Therapy

## 2019-04-01 ENCOUNTER — Ambulatory Visit: Payer: Self-pay | Admitting: Occupational Therapy

## 2019-04-05 ENCOUNTER — Ambulatory Visit: Payer: Self-pay | Admitting: Occupational Therapy

## 2019-04-05 ENCOUNTER — Other Ambulatory Visit: Payer: Self-pay

## 2019-04-05 DIAGNOSIS — I890 Lymphedema, not elsewhere classified: Secondary | ICD-10-CM

## 2019-04-05 NOTE — Therapy (Signed)
[facility] [address] [place], [place], [address] Phone: [phone]   Fax:  [phone]  Occupational Therapy Treatment  Patient Details  Name: Jaclyn Kramer [place]on MRN: [id number] Date of Birth: 03-24-1956 Referring Provider (OT): Baltazar NajjarMichael Robson, MD   Encounter Date: 04/05/2019  OT End of Session - 04/05/19 1645    Visit Number  7    Number of Visits  36    Date for OT Re-Evaluation  06/08/19       No past medical history on file.  No past surgical history on file.  There were no vitals filed for this visit.  Subjective Assessment - 04/05/19 1329    Subjective   Jaclyn Kramer Volpi presents for OT visit 7/36 to address BLE lymphedema. Pt presents with compression bandages on LLE with Rosidal foam on outside over short stretch wraps. Praisded Patient for finally truing self wrapping as she has been reluctant to try it herself. Overall , she did a pretty good job, technique, despite being wraon in [employer], also shows she learned and was able to duplicate other features of gradient compression wrapping. Pt 30 min late for 60 min OT session again today, which necessitates abbreviating session.    Patient is accompanied by:  Family member   mother, Ron ParkerWillie Teti   Pertinent History  Hx anemia, HTN, anxiety disorder, major depressive disorder, hx non-adherance to medical treatment;    Limitations  limits ambulation, standing tolerance, transfers, bed mobility, ascending and descending stairs, difficulty fitting LB clothing and shoes. difficulty reaching feet (AROM) difficulty bathing    Repetition  Increases Symptoms    Special Tests  + Stemmer dorsal feet Lymphedema Life Impact Scale (LLIS) TBA next visit    Pain Onset  More than a month ago                   OT Treatments/Exercises (OP) - 04/05/19 0001      ADLs   ADL Education Given  Yes      Manual Therapy   Manual Therapy  Edema management;Manual Lymphatic  Drainage (MLD);Compression Bandaging    Manual therapy comments  skin care below knee on LLE    Manual Lymphatic Drainage (MLD)  MLD to RLE/ RLQ utilizing short neck  sequence, deep  abdominal pathways and functional inguinal LN.    Compression Bandaging  LLE multilayer gradient compression wrap from base of toes to tibial tuberosity using 8, 10 and 12" short stretch wraps over single layer of 0.4 cm Rosidal foam and cotton stockinett. Circumferential wrap only today             OT Education - 04/05/19 1645    Education Details  Resumed Pt edu for lymphedema self care compression wraping to ensure Pt removed wraps daily, inspects and bathes skin and is able to reapply them correctly between visits to ensure safety and optimal swelling reduction.    Person(s) Educated  Patient    Methods  Explanation;Demonstration;Handout;Tactile cues;Verbal cues    Comprehension  Verbalized understanding;Returned demonstration;Verbal cues required;Tactile cues required;Need further instruction          OT Long Term Goals - 03/11/19 1654      OT LONG TERM GOAL #1   Title  Pt will apply BLE, knee length, multi-layer, short stretch compression wraps daily using correct gradient techniques with min caregiver assistance     to achieve optimal limb volume reduction, to return affected limb , as closely as possible, to premorbid  size and shape, to limit infection risk, and to improve safe functional ambulation and mobility.    Baseline  dependent    Time  4    Period  Days    Status  New    Target Date  --   4th OT Rx visit     OT LONG TERM GOAL #2   Title  Pt to achieve at least 10% BLE limb volume reductions below the knees during Intensive Phase CDT to increase  standing / walking tolerance , to increase functional performance of basic and instrumental ADLs, and to limit LE progression.    Baseline  dependent    Time  12    Period  Weeks    Status  New    Target Date  06/08/19      OT LONG TERM  GOAL #3   Title  Pt will be able to verbalize signs and symptoms of cellulitis infection and identify 4 common lymphedema precautions using printed resource for reference (modified independence) to limit LE progression over time .    Baseline  Max A    Time  4    Period  Days    Status  New    Target Date  --   4th OT Rx visit     OT LONG TERM GOAL #4   Title  Pt will achieve and sustain at least 85%  compliance with daily LE self-care home program (skin care, lymphatic pumping therex, compression and simple self-MLD) ) with min CG assistance during Intensive Phase CDT  to limit  limb swelling, reduce infection risk,  limit associated pain , and limit LE progression.    Baseline  Dependent    Time  12    Period  Weeks    Status  New    Target Date  06/08/19      OT LONG TERM GOAL #5   Title  Once issued Pt will be able to don and doff appropriate compression garments and/ or devices using correct techniques and assistive devices with extra time (modified independence) by end of Intensive Phase CDT for optimal LE management to limit progression over time.    Baseline  Max A    Time  12    Period  Weeks    Status  New    Target Date  06/08/19      Long Term Additional Goals   Additional Long Term Goals  Yes      OT LONG TERM GOAL #6   Title  During self-management phase of CDT Pt will retain limb volume reductions achieved during Intensive Phase CDT with no more than 3% volume increase to limit LE progression and further functional decline.    Baseline  Max A    Time  6    Period  Months    Status  New    Target Date  09/07/19            Plan - 04/05/19 1648    Clinical Impression Statement  Pt practiced compression wrapping as instructed during vistit interval. Although It wasn't quite right she did demonstrate some techniques taught in previous sessions. Pt was rightly proud of herself for trying. Provided abbreviated MLD and skin care before reapplying compression wraps.  Session abbreviated today due to late arriveal. Cont as per POC.    OT Occupational Profile and History  Comprehensive Assessment- Review of records and extensive additional review of physical, cognitive, psychosocial history related to current functional  performance    Occupational performance deficits (Please refer to evaluation for details):  ADL's;Leisure;IADL's;Social Participation;Work;Other   parenting role, self esteme, body image   Body Structure / Function / Physical Skills  ADL;Obesity;Decreased knowledge of precautions;Balance;Decreased knowledge of use of DME;Flexibility;IADL;Pain;Skin integrity;Edema;Mobility;ROM;Gait    Rehab Potential  Good    Clinical Decision Making  Several treatment options, min-mod task modification necessary    Comorbidities Affecting Occupational Performance:  Presence of comorbidities impacting occupational performance    Comorbidities impacting occupational performance description:  See SUBJECTIVE    Modification or Assistance to Complete Evaluation   Min-Moderate modification of tasks or assist with assess necessary to complete eval    OT Frequency  2x / week    OT Duration  12 weeks   and PRN   OT Treatment/Interventions  Self-care/ADL training;Manual lymph drainage;Coping strategies training;Patient/family education;Compression bandaging;Therapist, nutritional;Therapeutic activities;Manual Therapy;DME and/or AE instruction;Other (comment)   myofacial release, skin care   Plan  treat one leg at a time to limit fall risk. Fit with custom compression garments bilaterally needed to achieve correct containment and fit. Consider fitting with 32-chamber, sequential pneumatic compression device (Tactile Medical Flexitouch "pump" ). The Flexitouch SPCD is the only sequential device that duplicates proximal to distal lymphatic decongestion to return interstitial fluid through inguinal and abdominal lymphatics to the heart. A basic pneumatic device is not  appropriate for this patient because it does not treat deep abdominal lymphatics essential for transporting lymphatic congestion to the thoracic duct to return to circulation.       Patient will benefit from skilled therapeutic intervention in order to improve the following deficits and impairments:   Body Structure / Function / Physical Skills: ADL, Obesity, Decreased knowledge of precautions, Balance, Decreased knowledge of use of DME, Flexibility, IADL, Pain, Skin integrity, Edema, Mobility, ROM, Gait       Visit Diagnosis: Lymphedema, not elsewhere classified    Problem List Patient Active Problem List   Diagnosis Date Noted  . 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 Anxiety state 11/28/2009  . Depressive disorder 11/28/2009  . Essential hypertension 11/28/2009    Andrey Spearman, MS, OTR/L, [facility] 04/05/19 4:50 PM  [place] MAIN [facility] SERVICES [address] [place], [place], [address] Phone: [phone]   Fax:  [phone]  Name: Roxanna Mcever MRN: [id number] Date of Birth: 09-Sep-1955

## 2019-04-07 ENCOUNTER — Other Ambulatory Visit: Payer: Self-pay

## 2019-04-07 ENCOUNTER — Ambulatory Visit: Payer: Self-pay | Admitting: Occupational Therapy

## 2019-04-07 DIAGNOSIS — I890 Lymphedema, not elsewhere classified: Secondary | ICD-10-CM

## 2019-04-07 NOTE — Therapy (Signed)
[place] [facility] MAIN [facility] SERVICES [address] [place], [place], [address] Phone: [phone]   Fax:  [phone]  Occupational Therapy Treatment  Patient Details  Name: Jaclyn Kramer MRN: [id number] Date of Birth: [birth year] Referring Provider (OT): Baltazar Najjar, MD   Encounter Date: 04/07/2019  OT End of Session - 04/07/19 1510    Visit Number  8    Number of Visits  36    Date for OT Re-Evaluation  06/08/19    OT Start Time  0100    OT Stop Time  0210    OT Time Calculation (min)  70 min    Activity Tolerance  Patient tolerated treatment well;No increased pain       No past medical history on file.  No past surgical history on file.  There were no vitals filed for this visit.  Subjective Assessment - 04/07/19 1310    Subjective   [name] presents for OT visit 8/36 to address BLE lymphedema. Pt presents on time, BUT she did NOT rewrap her R leg during visit interval.  Pt is accompanied by her sister , [name]. Pt    Patient is accompanied by:  Family member   mother, Jaclyn Kramer   Pertinent History  Hx anemia, HTN, anxiety disorder, major depressive disorder, hx non-adherance to medical treatment;    Limitations  limits ambulation, standing tolerance, transfers, bed mobility, ascending and descending stairs, difficulty fitting LB clothing and shoes. difficulty reaching feet (AROM) difficulty bathing    Repetition  Increases Symptoms    Special Tests  + Stemmer dorsal feet Lymphedema Life Impact Scale (LLIS) TBA next visit    Pain Onset  More than a month ago                           OT Education - 04/07/19 1510    Education Details  Resumed Pt edu for lymphedema self care compression wraping to ensure Pt removed wraps daily, inspects and bathes skin and is able to reapply them correctly between visits to ensure safety and optimal swelling reduction.    Person(s) Educated  Patient    Methods   Explanation;Demonstration;Handout;Tactile cues;Verbal cues    Comprehension  Verbalized understanding;Returned demonstration;Verbal cues required;Tactile cues required;Need further instruction          OT Long Term Goals - 03/11/19 1654      OT LONG TERM GOAL #1   Title  Pt will apply BLE, knee length, multi-layer, short stretch compression wraps daily using correct gradient techniques with min caregiver assistance     to achieve optimal limb volume reduction, to return affected limb , as closely as possible, to premorbid size and shape, to limit infection risk, and to improve safe functional ambulation and mobility.    Baseline  dependent    Time  4    Period  Days    Status  New    Target Date  --   4th OT Rx visit     OT LONG TERM GOAL #2   Title  Pt to achieve at least 10% BLE limb volume reductions below the knees during Intensive Phase CDT to increase  standing / walking tolerance , to increase functional performance of basic and instrumental ADLs, and to limit LE progression.    Baseline  dependent    Time  12    Period  Weeks    Status  New    Target Date  06/08/19      OT LONG TERM GOAL #3   Title  Pt will be able to verbalize signs and symptoms of cellulitis infection and identify 4 common lymphedema precautions using printed resource for reference (modified independence) to limit LE progression over time .    Baseline  Max A    Time  4    Period  Days    Status  New    Target Date  --   4th OT Rx visit     OT LONG TERM GOAL #4   Title  Pt will achieve and sustain at least 85%  compliance with daily LE self-care home program (skin care, lymphatic pumping therex, compression and simple self-MLD) ) with min CG assistance during Intensive Phase CDT  to limit  limb swelling, reduce infection risk,  limit associated pain , and limit LE progression.    Baseline  Dependent    Time  12    Period  Weeks    Status  New    Target Date  06/08/19      OT LONG TERM GOAL #5    Title  Once issued Pt will be able to don and doff appropriate compression garments and/ or devices using correct techniques and assistive devices with extra time (modified independence) by end of Intensive Phase CDT for optimal LE management to limit progression over time.    Baseline  Max A    Time  12    Period  Weeks    Status  New    Target Date  06/08/19      Long Term Additional Goals   Additional Long Term Goals  Yes      OT LONG TERM GOAL #6   Title  During self-management phase of CDT Pt will retain limb volume reductions achieved during Intensive Phase CDT with no more than 3% volume increase to limit LE progression and further functional decline.    Baseline  Max A    Time  6    Period  Months    Status  New    Target Date  09/07/19            Plan - 04/07/19 1510    Clinical Impression Statement  Pt again encouraged to remove compression wraps daily , inspect , bathe and care for skin, then reapply wraps to limit infection        risk, risk of bandage friction causing irritation or wounds, and to facilitate optimal limb decongestion and volume reduction. Reassurewd Pt that she demonstrated good skills after skilled teaching at past visits and is ready to practice wrapping at home between visits.  Commenced MLD and Pt tolerated well, finally relaxing about 30 minutes into session. Skin mobility, hydration and flexibility continue to improve. Cont as per POC.    OT Occupational Profile and History  Comprehensive Assessment- Review of records and extensive additional review of physical, cognitive, psychosocial history related to current functional performance    Occupational performance deficits (Please refer to evaluation for details):  ADL's;Leisure;IADL's;Social Participation;Work;Other   parenting role, self esteme, body image   Body Structure / Function / Physical Skills  ADL;Obesity;Decreased knowledge of precautions;Balance;Decreased knowledge of use of  DME;Flexibility;IADL;Pain;Skin integrity;Edema;Mobility;ROM;Gait    Rehab Potential  Good    Clinical Decision Making  Several treatment options, min-mod task modification necessary    Comorbidities Affecting Occupational Performance:  Presence of comorbidities impacting occupational performance    Comorbidities impacting occupational performance description:  See SUBJECTIVE  Modification or Assistance to Complete Evaluation   Min-Moderate modification of tasks or assist with assess necessary to complete eval    OT Frequency  2x / week    OT Duration  12 weeks   and PRN   OT Treatment/Interventions  Self-care/ADL training;Manual lymph drainage;Coping strategies training;Patient/family education;Compression bandaging;Therapist, nutritional;Therapeutic activities;Manual Therapy;DME and/or AE instruction;Other (comment)   myofacial release, skin care   Plan  treat one leg at a time to limit fall risk. Fit with custom compression garments bilaterally needed to achieve correct containment and fit. Consider fitting with 32-chamber, sequential pneumatic compression device (Tactile Medical Flexitouch "pump" ). The Flexitouch SPCD is the only sequential device that duplicates proximal to distal lymphatic decongestion to return interstitial fluid through inguinal and abdominal lymphatics to the heart. A basic pneumatic device is not appropriate for this patient because it does not treat deep abdominal lymphatics essential for transporting lymphatic congestion to the thoracic duct to return to circulation.       Patient will benefit from skilled therapeutic intervention in order to improve the following deficits and impairments:   Body Structure / Function / Physical Skills: ADL, Obesity, Decreased knowledge of precautions, Balance, Decreased knowledge of use of DME, Flexibility, IADL, Pain, Skin integrity, Edema, Mobility, ROM, Gait       Visit Diagnosis: Lymphedema, not elsewhere  classified    Problem List Patient Active Problem List   Diagnosis Date Noted  . 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 Anxiety state 11/28/2009  . Depressive disorder 11/28/2009  . Essential hypertension 11/28/2009    Andrey Spearman, MS, OTR/L, [facility] 04/07/19 3:14 PM  [place] MAIN [facility] SERVICES [address] [place], [place], [address] Phone: [phone]   Fax:  [phone]  Name: Lamira Borin MRN: [id number] Date of Birth: 1955-08-20

## 2019-04-09 ENCOUNTER — Other Ambulatory Visit: Payer: Self-pay

## 2019-04-09 ENCOUNTER — Ambulatory Visit: Payer: Self-pay | Admitting: Occupational Therapy

## 2019-04-09 DIAGNOSIS — I890 Lymphedema, not elsewhere classified: Secondary | ICD-10-CM

## 2019-04-09 NOTE — Therapy (Signed)
[place] MAIN [facility] SERVICES [address] [place], [place], [address] Phone: [phone]   Fax:  [phone]  Occupational Therapy Treatment  Patient Details  Name: Jaclyn Kramer MRN: [id number] Date of Birth: 1956-02-09 Referring Provider (OT): Linton Ham, MD   Encounter Date: 04/09/2019  OT End of Session - 04/09/19 1126    Visit Number  9    Number of Visits  36    Date for OT Re-Evaluation  06/08/19    OT Start Time  1110    OT Stop Time  1210    OT Time Calculation (min)  60 min    Activity Tolerance  Patient tolerated treatment well;No increased pain       No past medical history on file.  No past surgical history on file.  There were no vitals filed for this visit.  Subjective Assessment - 04/09/19 1124    Subjective   Jaclyn Kramer presents for OT visit 9/36 to address BLE lymphedema. Pt presents 10 minutes late for a 60 min appointment. Pt brings clean , rolled wraps to clinic as instructed. She reports she hasn't wrapped her leg since last visit.    Patient is accompanied by:  Family member   mother, Ruther Ephraim   Pertinent History  Hx anemia, HTN, anxiety disorder, major depressive disorder, hx non-adherance to medical treatment;    Limitations  limits ambulation, standing tolerance, transfers, bed mobility, ascending and descending stairs, difficulty fitting LB clothing and shoes. difficulty reaching feet (AROM) difficulty bathing    Repetition  Increases Symptoms    Special Tests  + Stemmer dorsal feet Lymphedema Life Impact Scale (LLIS) TBA next visit    Currently in Pain?  No/denies    Pain Onset  More than a month ago                   OT Treatments/Exercises (OP) - 04/09/19 0001      ADLs   ADL Education Given  Yes      Manual Therapy   Manual Therapy  Edema management    Manual therapy comments  skin care below knee on LLE    Manual Lymphatic Drainage (MLD)  MLD to RLE/ RLQ utilizing short  neck  sequence, deep  abdominal pathways and functional inguinal LN.    Compression Bandaging  LLE multilayer gradient compression wrap from base of toes to tibial tuberosity using 8, 10 and 12" short stretch wraps over single layer of 0.4 cm Rosidal foam and cotton stockinett. Circumferential wrap only today             OT Education - 04/09/19 1126    Education Details  Resumed Pt edu for lymphedema self care compression wraping to ensure Pt removed wraps daily, inspects and bathes skin and is able to reapply them correctly between visits to ensure safety and optimal swelling reduction.    Person(s) Educated  Patient    Methods  Explanation;Demonstration;Handout;Tactile cues;Verbal cues    Comprehension  Verbalized understanding;Returned demonstration;Verbal cues required;Tactile cues required;Need further instruction          OT Long Term Goals - 03/11/19 1654      OT LONG TERM GOAL #1   Title  Pt will apply BLE, knee length, multi-layer, short stretch compression wraps daily using correct gradient techniques with min caregiver assistance     to achieve optimal limb volume reduction, to return affected limb , as closely as possible, to premorbid size and shape, to  limit infection risk, and to improve safe functional ambulation and mobility.    Baseline  dependent    Time  4    Period  Days    Status  New    Target Date  --   4th OT Rx visit     OT LONG TERM GOAL #2   Title  Pt to achieve at least 10% BLE limb volume reductions below the knees during Intensive Phase CDT to increase  standing / walking tolerance , to increase functional performance of basic and instrumental ADLs, and to limit LE progression.    Baseline  dependent    Time  12    Period  Weeks    Status  New    Target Date  06/08/19      OT LONG TERM GOAL #3   Title  Pt will be able to verbalize signs and symptoms of cellulitis infection and identify 4 common lymphedema precautions using printed resource for  reference (modified independence) to limit LE progression over time .    Baseline  Max A    Time  4    Period  Days    Status  New    Target Date  --   4th OT Rx visit     OT LONG TERM GOAL #4   Title  Pt will achieve and sustain at least 85%  compliance with daily LE self-care home program (skin care, lymphatic pumping therex, compression and simple self-MLD) ) with min CG assistance during Intensive Phase CDT  to limit  limb swelling, reduce infection risk,  limit associated pain , and limit LE progression.    Baseline  Dependent    Time  12    Period  Weeks    Status  New    Target Date  06/08/19      OT LONG TERM GOAL #5   Title  Once issued Pt will be able to don and doff appropriate compression garments and/ or devices using correct techniques and assistive devices with extra time (modified independence) by end of Intensive Phase CDT for optimal LE management to limit progression over time.    Baseline  Max A    Time  12    Period  Weeks    Status  New    Target Date  06/08/19      Long Term Additional Goals   Additional Long Term Goals  Yes      OT LONG TERM GOAL #6   Title  During self-management phase of CDT Pt will retain limb volume reductions achieved during Intensive Phase CDT with no more than 3% volume increase to limit LE progression and further functional decline.    Baseline  Max A    Time  6    Period  Months    Status  New    Target Date  09/07/19            Plan - 04/09/19 1205    Clinical Impression Statement  Provided MLD, skin care , compression wrapping, and Pt edu . Reminded Pt to rewrap every day to limit infection and risk of wounds. Pt tolerated all protocols well. Cont as per POC.    OT Occupational Profile and History  Comprehensive Assessment- Review of records and extensive additional review of physical, cognitive, psychosocial history related to current functional performance    Occupational performance deficits (Please refer to  evaluation for details):  ADL's;Leisure;IADL's;Social Participation;Work;Other   parenting role, self esteme, body  image   Body Structure / Function / Physical Skills  ADL;Obesity;Decreased knowledge of precautions;Balance;Decreased knowledge of use of DME;Flexibility;IADL;Pain;Skin integrity;Edema;Mobility;ROM;Gait    Rehab Potential  Good    Clinical Decision Making  Several treatment options, min-mod task modification necessary    Comorbidities Affecting Occupational Performance:  Presence of comorbidities impacting occupational performance    Comorbidities impacting occupational performance description:  See SUBJECTIVE    Modification or Assistance to Complete Evaluation   Min-Moderate modification of tasks or assist with assess necessary to complete eval    OT Frequency  2x / week    OT Duration  12 weeks   and PRN   OT Treatment/Interventions  Self-care/ADL training;Manual lymph drainage;Coping strategies training;Patient/family education;Compression bandaging;Building services engineerunctional Mobility Training;Therapeutic activities;Manual Therapy;DME and/or AE instruction;Other (comment)   myofacial release, skin care   Plan  treat one leg at a time to limit fall risk. Fit with custom compression garments bilaterally needed to achieve correct containment and fit. Consider fitting with 32-chamber, sequential pneumatic compression device (Tactile Medical Flexitouch "pump" ). The Flexitouch SPCD is the only sequential device that duplicates proximal to distal lymphatic decongestion to return interstitial fluid through inguinal and abdominal lymphatics to the heart. A basic pneumatic device is not appropriate for this patient because it does not treat deep abdominal lymphatics essential for transporting lymphatic congestion to the thoracic duct to return to circulation.       Patient will benefit from skilled therapeutic intervention in order to improve the following deficits and impairments:   Body Structure /  Function / Physical Skills: ADL, Obesity, Decreased knowledge of precautions, Balance, Decreased knowledge of use of DME, Flexibility, IADL, Pain, Skin integrity, Edema, Mobility, ROM, Gait       Visit Diagnosis: Lymphedema, not elsewhere classified    Problem List Patient Active Problem List   Diagnosis Date Noted  . 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 Anxiety state 11/28/2009  . Depressive disorder 11/28/2009  . Essential hypertension 11/28/2009    Loel Dubonnetheresa Cuca Benassi, MS, OTR/L, [facility] 04/09/19 12:09 PM  [place] [facility] [address] [place], [place], [address] Phone: [phone]   Fax:  [phone]  Name: Army ChacoDale Honda MRN: [id number] Date of Birth: 1955-12-12

## 2019-04-12 ENCOUNTER — Ambulatory Visit: Payer: Self-pay | Attending: Internal Medicine | Admitting: Occupational Therapy

## 2019-04-12 ENCOUNTER — Other Ambulatory Visit: Payer: Self-pay

## 2019-04-12 DIAGNOSIS — I890 Lymphedema, not elsewhere classified: Secondary | ICD-10-CM | POA: Insufficient documentation

## 2019-04-12 NOTE — Therapy (Signed)
[place] MAIN [facility] SERVICES [address] [place], [place], [address] Phone: [phone]   Fax:  [phone]  Occupational Therapy Treatment Note and Progress Report  Patient Details  Name: Jaclyn Kramer MRN: [id number] Date of Birth: 1956/04/30 Referring Provider (OT): Linton Ham, MD   Encounter Date: 04/12/2019  OT End of Session - 04/12/19 1650    Visit Number  10    Number of Visits  36    Date for OT Re-Evaluation  06/08/19    OT Start Time  0117    OT Stop Time  0205    OT Time Calculation (min)  48 min    Activity Tolerance  Patient tolerated treatment well;No increased pain       No past medical history on file.  No past surgical history on file.  There were no vitals filed for this visit.  Subjective Assessment - 04/12/19 1325    Subjective   Jaclyn Kramer presents for OT visit 10/36 to address BLE lymphedema. Pt presents 17 minutes late for a 60 min appointment. Pt presents with wraps in place on RLE    Patient is accompanied by:  Family member   mother, Theresea Trautmann   Pertinent History  Hx anemia, HTN, anxiety disorder, major depressive disorder, hx non-adherance to medical treatment;    Limitations  limits ambulation, standing tolerance, transfers, bed mobility, ascending and descending stairs, difficulty fitting LB clothing and shoes. difficulty reaching feet (AROM) difficulty bathing    Repetition  Increases Symptoms    Special Tests  + Stemmer dorsal feet Lymphedema Life Impact Scale (LLIS) TBA next visit    Pain Onset  More than a month ago          LYMPHEDEMA/ONCOLOGY QUESTIONNAIRE - 04/12/19 1644      Right Lower Extremity Lymphedema   Other  RLE A-D= 5057.65 ml; RLE E-G= 7963.0 ml; RLE A-G=13020.65 ml.    Other  RLE below the knee volume is decreased by 21.47% since initially measured on 03/17/19, RLE thigh (E-G) is increased by 10.45%, and RLE full leg volume (A-G) = 13020.65 %              OT  Treatments/Exercises (OP) - 04/12/19 0001      ADLs   ADL Education Given  Yes      Manual Therapy   Manual Therapy  Edema management    Edema Management  RLE comparative llimb volumetrics.    Compression Bandaging  LLE multilayer gradient compression wrap from base of toes to tibial tuberosity using 8, 10 and 12" short stretch wraps over single layer of 0.4 cm Rosidal foam and cotton stockinett. Circumferential wrap only today             OT Education - 04/12/19 1358    Education Details  Continued skilled Pt/caregiver education  And LE ADL training throughout visit for lymphedema self care/ home program, including compression wrapping, compression garment and device wear/care, lymphatic pumping ther ex, simple self-MLD, and skin care. Discussed progress towards goals.    Person(s) Educated  Patient    Methods  Explanation;Demonstration;Handout;Tactile cues;Verbal cues    Comprehension  Verbalized understanding;Returned demonstration;Verbal cues required;Tactile cues required;Need further instruction          OT Long Term Goals - 04/12/19 1300      OT LONG TERM GOAL #1   Title  Pt will apply BLE, knee length, multi-layer, short stretch compression wraps daily using correct gradient techniques with min  caregiver assistance     to achieve optimal limb volume reduction, to return affected limb , as closely as possible, to premorbid size and shape, to limit infection risk, and to improve safe functional ambulation and mobility.    Baseline  dependent    Time  4    Period  Days    Status  Achieved    Target Date  04/12/19      OT LONG TERM GOAL #2   Title  Pt to achieve at least 10% BLE limb volume reductions below the knees during Intensive Phase CDT to increase  standing / walking tolerance , to increase functional performance of basic and instrumental ADLs, and to limit LE progression.    Baseline  dependent    Time  12    Period  Weeks    Status  Partially Met    Target  Date  --   for RLE A-D with 21.47%  vol reduction     OT LONG TERM GOAL #3   Title  Pt will be able to verbalize signs and symptoms of cellulitis infection and identify 4 common lymphedema precautions using printed resource for reference (modified independence) to limit LE progression over time .    Baseline  Max A    Period  Days    Status  Partially Met    Target Date  --   tardiness limits progress on this goal     OT LONG TERM GOAL #4   Title  Pt will achieve and sustain at least 85%  compliance with daily LE self-care home program (skin care, lymphatic pumping therex, compression and simple self-MLD) ) with min CG assistance during Intensive Phase CDT  to limit  limb swelling, reduce infection risk,  limit associated pain , and limit LE progression.    Baseline  Dependent    Time  12    Period  Weeks    Status  On-going    Target Date  --   Pt working towards this goal     OT LONG TERM GOAL #5   Title  Once issued Pt will be able to don and doff appropriate compression garments and/ or devices using correct techniques and assistive devices with extra time (modified independence) by end of Intensive Phase CDT for optimal LE management to limit progression over time.    Baseline  Max A    Time  12    Period  Weeks    Status  On-going    Target Date  --   unable to assess     OT LONG TERM GOAL #6   Title  During self-management phase of CDT Pt will retain limb volume reductions achieved during Intensive Phase CDT with no more than 3% volume increase to limit LE progression and further functional decline.    Baseline  Max A    Time  6    Period  Months    Status  On-going    Target Date  --   carry forward           Plan - 04/12/19 1650    Clinical Impression Statement  Pt continues to have difficulty arriving at  therapy sessions on time. Progress towards all goals is slowed due to tardiness. We've discussed this several times and Pt is actively working on improving  timlliness. Completed RLE comparative limb volumetrics today. RLE below the knee volume is decreased by 21.47% since initially measured on 03/17/19, RLE thigh (E-G) is  increased by 10.45%, and RLE full leg volume (A-G) = 13020.65 %. These values meet and exceed 10% volumetric reduction goal for RLE below the knee, but it appears lymphatic congestion has been pushed proximally into thigh and remains somewhat stagnant . Pt understands that we'll commence full leg wrapping at next session. This also informs selection of thigh length compression garments in future. Pt has not yet met goal for lymphedema precautions. We'll address that at next visit until completed. Pt demonstrates some progress towards all remaining OT goals for LE care. See Long Tertm Golas section for additional details. No MLD today due to time constraints. Cont as per OPC.    OT Occupational Profile and History  Comprehensive Assessment- Review of records and extensive additional review of physical, cognitive, psychosocial history related to current functional performance    Occupational performance deficits (Please refer to evaluation for details):  ADL's;Leisure;IADL's;Social Participation;Work;Other   parenting role, self esteme, body image   Body Structure / Function / Physical Skills  ADL;Obesity;Decreased knowledge of precautions;Balance;Decreased knowledge of use of DME;Flexibility;IADL;Pain;Skin integrity;Edema;Mobility;ROM;Gait    Rehab Potential  Good    Clinical Decision Making  Several treatment options, min-mod task modification necessary    Comorbidities Affecting Occupational Performance:  Presence of comorbidities impacting occupational performance    Comorbidities impacting occupational performance description:  See SUBJECTIVE    Modification or Assistance to Complete Evaluation   Min-Moderate modification of tasks or assist with assess necessary to complete eval    OT Frequency  2x / week    OT Duration  12 weeks   and  PRN   OT Treatment/Interventions  Self-care/ADL training;Manual lymph drainage;Coping strategies training;Patient/family education;Compression bandaging;Therapist, nutritional;Therapeutic activities;Manual Therapy;DME and/or AE instruction;Other (comment)   myofacial release, skin care   Plan  treat one leg at a time to limit fall risk. Fit with custom compression garments bilaterally needed to achieve correct containment and fit. Consider fitting with 32-chamber, sequential pneumatic compression device (Tactile Medical Flexitouch "pump" ). The Flexitouch SPCD is the only sequential device that duplicates proximal to distal lymphatic decongestion to return interstitial fluid through inguinal and abdominal lymphatics to the heart. A basic pneumatic device is not appropriate for this patient because it does not treat deep abdominal lymphatics essential for transporting lymphatic congestion to the thoracic duct to return to circulation.       Patient will benefit from skilled therapeutic intervention in order to improve the following deficits and impairments:   Body Structure / Function / Physical Skills: ADL, Obesity, Decreased knowledge of precautions, Balance, Decreased knowledge of use of DME, Flexibility, IADL, Pain, Skin integrity, Edema, Mobility, ROM, Gait       Visit Diagnosis: Lymphedema, not elsewhere classified    Problem List Patient Active Problem List   Diagnosis Date Noted  . 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 Anxiety state 11/28/2009  . Depressive disorder 11/28/2009  . Essential hypertension 11/28/2009    Andrey Spearman, MS, OTR/L, [facility] 04/12/19 4:56 PM  [place] MAIN [facility] SERVICES [address] [place], [place], [address] Phone: [phone]   Fax:  [phone]  Name: Jaclyn Kramer MRN: [id number] Date of Birth: 04-10-1956

## 2019-04-14 ENCOUNTER — Ambulatory Visit: Payer: Self-pay | Admitting: Occupational Therapy

## 2019-04-15 ENCOUNTER — Ambulatory Visit: Payer: Self-pay | Admitting: Occupational Therapy

## 2019-04-19 ENCOUNTER — Other Ambulatory Visit: Payer: Self-pay

## 2019-04-19 ENCOUNTER — Ambulatory Visit: Payer: Self-pay | Admitting: Occupational Therapy

## 2019-04-19 DIAGNOSIS — I890 Lymphedema, not elsewhere classified: Secondary | ICD-10-CM

## 2019-04-19 NOTE — Therapy (Signed)
[place] MAIN [facility] SERVICES [address] [place], [place], [address] Phone: [phone]   Fax:  [phone]  Occupational Therapy Treatment  Patient Details  Name: Jaclyn Kramer MRN: [id number] Date of Birth: 01-05-1956 Referring Provider (OT): Linton Ham, MD   Encounter Date: 04/19/2019  OT End of Session - 04/19/19 1404    Visit Number  11    Number of Visits  36    Date for OT Re-Evaluation  06/08/19    OT Start Time  0120    OT Stop Time  0205    OT Time Calculation (min)  45 min    Activity Tolerance  Patient tolerated treatment well;No increased pain    Behavior During Therapy  Jaclyn Kramer for tasks assessed/performed       No past medical history on file.  No past surgical history on file.  There were no vitals filed for this visit.  Subjective Assessment - 04/19/19 1323    Subjective   Jaclyn Kramer presents for OT visit 10/36 to address BLE lymphedema. Pt presents 17 minutes late for a 60 min appointment. Pt presents with wraps in place on RLE    Patient is accompanied by:  Family member   mother, Jaclyn Kramer   Pertinent History  Hx anemia, HTN, anxiety disorder, major depressive disorder, hx non-adherance to medical treatment;    Limitations  limits ambulation, standing tolerance, transfers, bed mobility, ascending and descending stairs, difficulty fitting LB clothing and shoes. difficulty reaching feet (AROM) difficulty bathing    Repetition  Increases Symptoms    Special Tests  + Stemmer dorsal feet Lymphedema Life Impact Scale (LLIS) TBA next visit    Pain Onset  More than a month ago                           OT Education - 04/19/19 1404    Education Details  Continued skilled Pt/caregiver education  And LE ADL training throughout visit for lymphedema self care/ home program, including compression wrapping, compression garment and device wear/care, lymphatic pumping ther ex, simple self-MLD, and skin  care. Discussed progress towards goals.    Person(s) Educated  Patient    Methods  Explanation;Demonstration;Handout;Tactile cues;Verbal cues    Comprehension  Verbalized understanding;Returned demonstration;Verbal cues required;Tactile cues required;Need further instruction          OT Long Term Goals - 04/12/19 1300      OT LONG TERM GOAL #1   Title  Pt will apply BLE, knee length, multi-layer, short stretch compression wraps daily using correct gradient techniques with min caregiver assistance     to achieve optimal limb volume reduction, to return affected limb , as closely as possible, to premorbid size and shape, to limit infection risk, and to improve safe functional ambulation and mobility.    Baseline  dependent    Time  4    Period  Days    Status  Achieved    Target Date  04/12/19      OT LONG TERM GOAL #2   Title  Pt to achieve at least 10% BLE limb volume reductions below the knees during Intensive Phase CDT to increase  standing / walking tolerance , to increase functional performance of basic and instrumental ADLs, and to limit LE progression.    Baseline  dependent    Time  12    Period  Weeks    Status  Partially Met  Target Date  --   for RLE A-D with 21.47%  vol reduction     OT LONG TERM GOAL #3   Title  Pt will be able to verbalize signs and symptoms of cellulitis infection and identify 4 common lymphedema precautions using printed resource for reference (modified independence) to limit LE progression over time .    Baseline  Max A    Period  Days    Status  Partially Met    Target Date  --   tardiness limits progress on this goal     OT LONG TERM GOAL #4   Title  Pt will achieve and sustain at least 85%  compliance with daily LE self-care home program (skin care, lymphatic pumping therex, compression and simple self-MLD) ) with min CG assistance during Intensive Phase CDT  to limit  limb swelling, reduce infection risk,  limit associated pain , and limit  LE progression.    Baseline  Dependent    Time  12    Period  Weeks    Status  On-going    Target Date  --   Pt working towards this goal     OT LONG TERM GOAL #5   Title  Once issued Pt will be able to don and doff appropriate compression garments and/ or devices using correct techniques and assistive devices with extra time (modified independence) by end of Intensive Phase CDT for optimal LE management to limit progression over time.    Baseline  Max A    Time  12    Period  Weeks    Status  On-going    Target Date  --   unable to assess     OT LONG TERM GOAL #6   Title  During self-management phase of CDT Pt will retain limb volume reductions achieved during Intensive Phase CDT with no more than 3% volume increase to limit LE progression and further functional decline.    Baseline  Max A    Time  6    Period  Months    Status  On-going    Target Date  --   carry forward           Plan - 04/19/19 1405    Clinical Impression Statement  Jaclyn Kramer discussion with Pt re attendance and how it affects clinical outcomes. Pt encouraged to discuss any barriers to OT treatment plan at any time in effort to help identify solutions she she is successful with lymphedema management now and over time. Despite missed appointments and tardiness LLE limb volume continues to slowly reduce  and tissue density to decrease.  Pt is applying compression wraps between visits and skin condition also continues to improve re hydration and mobility. Pt tolerated all aspects of Rx today. Cobnt as per POC.    OT Occupational Profile and History  Comprehensive Assessment- Review of records and extensive additional review of physical, cognitive, psychosocial history related to current functional performance    Occupational performance deficits (Please refer to evaluation for details):  ADL's;Leisure;IADL's;Social Participation;Work;Other   parenting role, self esteme, body image   Body Structure / Function /  Physical Skills  ADL;Obesity;Decreased knowledge of precautions;Balance;Decreased knowledge of use of DME;Flexibility;IADL;Pain;Skin integrity;Edema;Mobility;ROM;Gait    Rehab Potential  Good    Clinical Decision Making  Several treatment options, min-mod task modification necessary    Comorbidities Affecting Occupational Performance:  Presence of comorbidities impacting occupational performance    Comorbidities impacting occupational performance description:  See SUBJECTIVE  Modification or Assistance to Complete Evaluation   Min-Moderate modification of tasks or assist with assess necessary to complete eval    OT Frequency  2x / week    OT Duration  12 weeks   and PRN   OT Treatment/Interventions  Self-care/ADL training;Manual lymph drainage;Coping strategies training;Patient/family education;Compression bandaging;Therapist, nutritional;Therapeutic activities;Manual Therapy;DME and/or AE instruction;Other (comment)   myofacial release, skin care   Plan  treat one leg at a time to limit fall risk. Fit with custom compression garments bilaterally needed to achieve correct containment and fit. Consider fitting with 32-chamber, sequential pneumatic compression device (Tactile Medical Flexitouch "pump" ). The Flexitouch SPCD is the only sequential device that duplicates proximal to distal lymphatic decongestion to return interstitial fluid through inguinal and abdominal lymphatics to the heart. A basic pneumatic device is not appropriate for this patient because it does not treat deep abdominal lymphatics essential for transporting lymphatic congestion to the thoracic duct to return to circulation.       Patient will benefit from skilled therapeutic intervention in order to improve the following deficits and impairments:   Body Structure / Function / Physical Skills: ADL, Obesity, Decreased knowledge of precautions, Balance, Decreased knowledge of use of DME, Flexibility, IADL, Pain, Skin  integrity, Edema, Mobility, ROM, Gait       Visit Diagnosis: Lymphedema, not elsewhere classified    Problem List Patient Active Problem List   Diagnosis Date Noted  . 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 Anxiety state 11/28/2009  . Depressive disorder 11/28/2009  . Essential hypertension 11/28/2009   Jaclyn Spearman, Jaclyn Kramer, Jaclyn Kramer, [facility] 04/19/19 3:11 PM  [place] MAIN [facility] SERVICES [address] [place], [place], [address] Phone: [phone]   Fax:  [phone]  Name: Jaclyn Kramer MRN: [id number] Date of Birth: Oct 17, 1955

## 2019-04-21 ENCOUNTER — Ambulatory Visit: Payer: Self-pay | Admitting: Occupational Therapy

## 2019-04-21 ENCOUNTER — Other Ambulatory Visit: Payer: Self-pay

## 2019-04-21 DIAGNOSIS — I890 Lymphedema, not elsewhere classified: Secondary | ICD-10-CM

## 2019-04-21 NOTE — Therapy (Signed)
[place] MAIN [facility] SERVICES [address] [place], [place], [address] Phone: [phone]   Fax:  [phone]  Occupational Therapy Treatment  Patient Details  Name: Jaclyn Kramer MRN: [id number] Date of Birth: 09-14-1955 Referring Provider (OT): Linton Ham, MD   Encounter Date: 04/21/2019  OT End of Session - 04/21/19 1402    Visit Number  12    Number of Visits  36    Date for OT Re-Evaluation  06/08/19    OT Start Time  0122    OT Stop Time  0203    OT Time Calculation (min)  41 min    Activity Tolerance  Patient tolerated treatment well;No increased pain    Behavior During Therapy  [facility] for tasks assessed/performed       No past medical history on file.  No past surgical history on file.  There were no vitals filed for this visit.  Subjective Assessment - 04/21/19 1358    Subjective   Jaclyn Kramer presents for OT visit 11/36 to address BLE lymphedema. Pt presents 22 minutes late for a 60 min appointment. Pt presents with wraps in place on RLE She brings clean wraps to clinic.    Patient is accompanied by:  Family member   mother, Jaclyn Kramer   Pertinent History  Hx anemia, HTN, anxiety disorder, major depressive disorder, hx non-adherance to medical treatment;    Limitations  limits ambulation, standing tolerance, transfers, bed mobility, ascending and descending stairs, difficulty fitting LB clothing and shoes. difficulty reaching feet (AROM) difficulty bathing    Repetition  Increases Symptoms    Special Tests  + Stemmer dorsal feet Lymphedema Life Impact Scale (LLIS) TBA next visit    Pain Onset  More than a month ago                   OT Treatments/Exercises (OP) - 04/21/19 0001      ADLs   ADL Education Given  Yes      Manual Therapy   Manual Therapy  Edema management    Manual Lymphatic Drainage (MLD)  MLD to RLE/ RLQ utilizing short neck  sequence, deep  abdominal pathways and functional inguinal  LN.    Compression Bandaging  LLE multilayer gradient compression wrap from base of toes to tibial tuberosity using 8, 10 and 12" short stretch wraps over single layer of 0.4 cm Rosidal foam and cotton stockinett. Circumferential wrap only today             OT Education - 04/21/19 1359    Education Details  Cont Pt edu for simple self MLD. Pt able to perform short neck sequence with mod A, including verbal and tactile cues, by end of session.    Person(s) Educated  Patient    Methods  Explanation;Demonstration;Handout;Tactile cues;Verbal cues    Comprehension  Verbalized understanding;Returned demonstration;Verbal cues required;Tactile cues required;Need further instruction          OT Long Term Goals - 04/12/19 1300      OT LONG TERM GOAL #1   Title  Pt will apply BLE, knee length, multi-layer, short stretch compression wraps daily using correct gradient techniques with min caregiver assistance     to achieve optimal limb volume reduction, to return affected limb , as closely as possible, to premorbid size and shape, to limit infection risk, and to improve safe functional ambulation and mobility.    Baseline  dependent    Time  4  Period  Days    Status  Achieved    Target Date  04/12/19      OT LONG TERM GOAL #2   Title  Pt to achieve at least 10% BLE limb volume reductions below the knees during Intensive Phase CDT to increase  standing / walking tolerance , to increase functional performance of basic and instrumental ADLs, and to limit LE progression.    Baseline  dependent    Time  12    Period  Weeks    Status  Partially Met    Target Date  --   for RLE A-D with 21.47%  vol reduction     OT LONG TERM GOAL #3   Title  Pt will be able to verbalize signs and symptoms of cellulitis infection and identify 4 common lymphedema precautions using printed resource for reference (modified independence) to limit LE progression over time .    Baseline  Max A    Period  Days     Status  Partially Met    Target Date  --   tardiness limits progress on this goal     OT LONG TERM GOAL #4   Title  Pt will achieve and sustain at least 85%  compliance with daily LE self-care home program (skin care, lymphatic pumping therex, compression and simple self-MLD) ) with min CG assistance during Intensive Phase CDT  to limit  limb swelling, reduce infection risk,  limit associated pain , and limit LE progression.    Baseline  Dependent    Time  12    Period  Weeks    Status  On-going    Target Date  --   Pt working towards this goal     OT LONG TERM GOAL #5   Title  Once issued Pt will be able to don and doff appropriate compression garments and/ or devices using correct techniques and assistive devices with extra time (modified independence) by end of Intensive Phase CDT for optimal LE management to limit progression over time.    Baseline  Max A    Time  12    Period  Weeks    Status  On-going    Target Date  --   unable to assess     OT LONG TERM GOAL #6   Title  During self-management phase of CDT Pt will retain limb volume reductions achieved during Intensive Phase CDT with no more than 3% volume increase to limit LE progression and further functional decline.    Baseline  Max A    Time  6    Period  Months    Status  On-going    Target Date  --   carry forward           Plan - 04/21/19 1630    Clinical Impression Statement  Provided MLD, skin care , compression wrapping, and Pt edu .Pt tolerted all aspects of OT without difficulty. Skin condition contnues to improve with improved hydration  and decreased density. Initial signs of hyper keritosis have resolved. Cont as per POC.    OT Occupational Profile and History  Comprehensive Assessment- Review of records and extensive additional review of physical, cognitive, psychosocial history related to current functional performance    Occupational performance deficits (Please refer to evaluation for details):   ADL's;Leisure;IADL's;Social Participation;Work;Other   parenting role, self esteme, body image   Body Structure / Function / Physical Skills  ADL;Obesity;Decreased knowledge of precautions;Balance;Decreased knowledge of use of DME;Flexibility;IADL;Pain;Skin  integrity;Edema;Mobility;ROM;Gait    Rehab Potential  Good    Clinical Decision Making  Several treatment options, min-mod task modification necessary    Comorbidities Affecting Occupational Performance:  Presence of comorbidities impacting occupational performance    Comorbidities impacting occupational performance description:  See SUBJECTIVE    Modification or Assistance to Complete Evaluation   Min-Moderate modification of tasks or assist with assess necessary to complete eval    OT Frequency  2x / week    OT Duration  12 weeks   and PRN   OT Treatment/Interventions  Self-care/ADL training;Manual lymph drainage;Coping strategies training;Patient/family education;Compression bandaging;Therapist, nutritional;Therapeutic activities;Manual Therapy;DME and/or AE instruction;Other (comment)   myofacial release, skin care   Plan  treat one leg at a time to limit fall risk. Fit with custom compression garments bilaterally needed to achieve correct containment and fit. Consider fitting with 32-chamber, sequential pneumatic compression device (Tactile Medical Flexitouch "pump" ). The Flexitouch SPCD is the only sequential device that duplicates proximal to distal lymphatic decongestion to return interstitial fluid through inguinal and abdominal lymphatics to the heart. A basic pneumatic device is not appropriate for this patient because it does not treat deep abdominal lymphatics essential for transporting lymphatic congestion to the thoracic duct to return to circulation.       Patient will benefit from skilled therapeutic intervention in order to improve the following deficits and impairments:   Body Structure / Function / Physical Skills:  ADL, Obesity, Decreased knowledge of precautions, Balance, Decreased knowledge of use of DME, Flexibility, IADL, Pain, Skin integrity, Edema, Mobility, ROM, Gait       Visit Diagnosis: Lymphedema, not elsewhere classified    Problem List Patient Active Problem List   Diagnosis Date Noted  . 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 Anxiety state 11/28/2009  . Depressive disorder 11/28/2009  . Essential hypertension 11/28/2009    Andrey Spearman, MS, OTR/L, [facility] 04/21/19 4:33 PM   [place] MAIN [facility] SERVICES [address] [place], [place], [address] Phone: [phone]   Fax:  [phone]  Name: Jaclyn Kramer MRN: [id number] Date of Birth: [birth year]

## 2019-04-22 ENCOUNTER — Other Ambulatory Visit: Payer: Self-pay

## 2019-04-22 ENCOUNTER — Ambulatory Visit: Payer: Self-pay | Admitting: Occupational Therapy

## 2019-04-22 ENCOUNTER — Encounter: Payer: Self-pay | Admitting: Occupational Therapy

## 2019-04-22 DIAGNOSIS — I890 Lymphedema, not elsewhere classified: Secondary | ICD-10-CM

## 2019-04-22 NOTE — Therapy (Signed)
[place] MAIN [facility] SERVICES [address] [place], [place], [address] Phone: [phone]   Fax:  [phone]  Occupational Therapy Treatment  Patient Details  Name: Jaclyn Kramer MRN: [id number] Date of Birth: May 06, 1956 Referring Provider (OT): Linton Ham, MD   Encounter Date: 04/22/2019  OT End of Session - 04/22/19 1410    Visit Number  13    Number of Visits  36    Date for OT Re-Evaluation  06/08/19    OT Start Time  0117    OT Stop Time  0207    OT Time Calculation (min)  50 min    Activity Tolerance  Patient tolerated treatment well;No increased pain    Behavior During Therapy  [facility] for tasks assessed/performed       History reviewed. No pertinent past medical history.  History reviewed. No pertinent surgical history.  There were no vitals filed for this visit.  Subjective Assessment - 04/22/19 1407    Subjective   Jaclyn Kramer presents for OT visit 12/36 to address BLE lymphedema. Pt presents 17 minutes late for a 60 min appointment. Pt presents with wraps in place on RLE She brings clean wraps to clinic. Pt denies leg pain today. She has no new concerns today.    Patient is accompanied by:  Family member   mother, Jaclyn Kramer   Pertinent History  Hx anemia, HTN, anxiety disorder, major depressive disorder, hx non-adherance to medical treatment;    Limitations  limits ambulation, standing tolerance, transfers, bed mobility, ascending and descending stairs, difficulty fitting LB clothing and shoes. difficulty reaching feet (AROM) difficulty bathing    Repetition  Increases Symptoms    Special Tests  + Stemmer dorsal feet Lymphedema Life Impact Scale (LLIS) TBA next visit    Pain Onset  More than a month ago                   OT Treatments/Exercises (OP) - 04/22/19 0001      ADLs   ADL Education Given  Yes      Manual Therapy   Manual Therapy  Edema management;Manual Lymphatic Drainage (MLD);Compression  Bandaging    Manual Lymphatic Drainage (MLD)  MLD to RLE/ RLQ utilizing short neck  sequence, deep  abdominal pathways and functional inguinal LN.    Compression Bandaging  LLE multilayer gradient compression wrap from base of toes to tibial tuberosity using 8, 10 and 12" short stretch wraps over single layer of 0.4 cm Rosidal foam and cotton stockinett. Circumferential wrap only today. Added 8 x 5" Kompre[employer] dot foam pad to posterior leg w lower edge anchored ! 2 " above ankle crease   with strtch net. Added this under stockinet et al in efforrt to reduce dense fibrosis.             OT Education - 04/22/19 1410    Education Details  Cont Pt edu for simple self MLD. Pt able to perform short neck sequence with mod A, including verbal and tactile cues, by end of session.    Person(s) Educated  Patient    Methods  Explanation;Demonstration;Handout;Tactile cues;Verbal cues    Comprehension  Verbalized understanding;Returned demonstration;Verbal cues required;Tactile cues required;Need further instruction          OT Long Term Goals - 04/12/19 1300      OT LONG TERM GOAL #1   Title  Pt will apply BLE, knee length, multi-layer, short stretch compression wraps daily using correct gradient techniques  with min caregiver assistance     to achieve optimal limb volume reduction, to return affected limb , as closely as possible, to premorbid size and shape, to limit infection risk, and to improve safe functional ambulation and mobility.    Baseline  dependent    Time  4    Period  Days    Status  Achieved    Target Date  04/12/19      OT LONG TERM GOAL #2   Title  Pt to achieve at least 10% BLE limb volume reductions below the knees during Intensive Phase CDT to increase  standing / walking tolerance , to increase functional performance of basic and instrumental ADLs, and to limit LE progression.    Baseline  dependent    Time  12    Period  Weeks    Status  Partially Met    Target Date  --    for RLE A-D with 21.47%  vol reduction     OT LONG TERM GOAL #3   Title  Pt will be able to verbalize signs and symptoms of cellulitis infection and identify 4 common lymphedema precautions using printed resource for reference (modified independence) to limit LE progression over time .    Baseline  Max A    Period  Days    Status  Partially Met    Target Date  --   tardiness limits progress on this goal     OT LONG TERM GOAL #4   Title  Pt will achieve and sustain at least 85%  compliance with daily LE self-care home program (skin care, lymphatic pumping therex, compression and simple self-MLD) ) with min CG assistance during Intensive Phase CDT  to limit  limb swelling, reduce infection risk,  limit associated pain , and limit LE progression.    Baseline  Dependent    Time  12    Period  Weeks    Status  On-going    Target Date  --   Pt working towards this goal     OT LONG TERM GOAL #5   Title  Once issued Pt will be able to don and doff appropriate compression garments and/ or devices using correct techniques and assistive devices with extra time (modified independence) by end of Intensive Phase CDT for optimal LE management to limit progression over time.    Baseline  Max A    Time  12    Period  Weeks    Status  On-going    Target Date  --   unable to assess     OT LONG TERM GOAL #6   Title  During self-management phase of CDT Pt will retain limb volume reductions achieved during Intensive Phase CDT with no more than 3% volume increase to limit LE progression and further functional decline.    Baseline  Max A    Time  6    Period  Months    Status  On-going    Target Date  --   carry forward           Plan - 04/22/19 1411    Clinical Impression Statement  Provided MLD, skin care , compression wrapping, and Pt edu .Pt tolerted all aspects of OT without difficulty. Skin condition contnues to improve with improved hydration  and decreased density. Initial signs of  hyper keritosis have resolved. Cont as per POC.    OT Occupational Profile and History  Comprehensive Assessment- Review of records and  extensive additional review of physical, cognitive, psychosocial history related to current functional performance    Occupational performance deficits (Please refer to evaluation for details):  ADL's;Leisure;IADL's;Social Participation;Work;Other   parenting role, self esteme, body image   Body Structure / Function / Physical Skills  ADL;Obesity;Decreased knowledge of precautions;Balance;Decreased knowledge of use of DME;Flexibility;IADL;Pain;Skin integrity;Edema;Mobility;ROM;Gait    Rehab Potential  Good    Clinical Decision Making  Several treatment options, min-mod task modification necessary    Comorbidities Affecting Occupational Performance:  Presence of comorbidities impacting occupational performance    Comorbidities impacting occupational performance description:  See SUBJECTIVE    Modification or Assistance to Complete Evaluation   Min-Moderate modification of tasks or assist with assess necessary to complete eval    OT Frequency  2x / week    OT Duration  12 weeks   and PRN   OT Treatment/Interventions  Self-care/ADL training;Manual lymph drainage;Coping strategies training;Patient/family education;Compression bandaging;Therapist, nutritional;Therapeutic activities;Manual Therapy;DME and/or AE instruction;Other (comment)   myofacial release, skin care   Plan  treat one leg at a time to limit fall risk. Fit with custom compression garments bilaterally needed to achieve correct containment and fit. Consider fitting with 32-chamber, sequential pneumatic compression device (Tactile Medical Flexitouch "pump" ). The Flexitouch SPCD is the only sequential device that duplicates proximal to distal lymphatic decongestion to return interstitial fluid through inguinal and abdominal lymphatics to the heart. A basic pneumatic device is not appropriate for this  patient because it does not treat deep abdominal lymphatics essential for transporting lymphatic congestion to the thoracic duct to return to circulation.       Patient will benefit from skilled therapeutic intervention in order to improve the following deficits and impairments:   Body Structure / Function / Physical Skills: ADL, Obesity, Decreased knowledge of precautions, Balance, Decreased knowledge of use of DME, Flexibility, IADL, Pain, Skin integrity, Edema, Mobility, ROM, Gait       Visit Diagnosis: Lymphedema, not elsewhere classified    Problem List Patient Active Problem List   Diagnosis Date Noted  . 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 Anxiety state 11/28/2009  . Depressive disorder 11/28/2009  . Essential hypertension 11/28/2009    Andrey Spearman, MS, OTR/L, [facility] 04/22/19 2:12 PM   [place] MAIN [facility] SERVICES [address] [place], [place], [address] Phone: [phone]   Fax:  [phone]  Name: Jaclyn Kramer MRN: [id number] Date of Birth: December 13, 1955

## 2019-04-26 ENCOUNTER — Other Ambulatory Visit: Payer: Self-pay

## 2019-04-26 ENCOUNTER — Ambulatory Visit: Payer: Self-pay | Admitting: Occupational Therapy

## 2019-04-26 DIAGNOSIS — I890 Lymphedema, not elsewhere classified: Secondary | ICD-10-CM

## 2019-04-27 NOTE — Therapy (Signed)
[place] MAIN [facility] SERVICES [address] [place], [place], [address] Phone: [phone]   Fax:  [phone]  Occupational Therapy Treatment  Patient Details  Name: Jaclyn Kramer MRN: [id number] Date of Birth: 1955-08-19 Referring Provider (OT): Linton Ham, MD   Encounter Date: 04/26/2019  OT End of Session - 04/26/19 2947    Visit Number  14    Number of Visits  36    Date for OT Re-Evaluation  06/08/19    OT Start Time  0121    OT Stop Time  0215    OT Time Calculation (min)  54 min    Activity Tolerance  Patient tolerated treatment well;No increased pain    Behavior During Therapy  [facility] for tasks assessed/performed       No past medical history on file.  No past surgical history on file.  There were no vitals filed for this visit.  Subjective Assessment - 04/26/19 1330    Subjective   Jaclyn Kramer presents for OT visit 13/36 to address BLE lymphedema. Pt presents 21 minutes late for a 60 min appointment. Pt presents with wraps in place on RLE She brings clean wraps to clinic. Pt denies leg pain today. "My back's hurting tho. I normally dont sleep on my back."    Patient is accompanied by:  Family member   mother, Jaclyn Kramer   Pertinent History  Hx anemia, HTN, anxiety disorder, major depressive disorder, hx non-adherance to medical treatment;    Limitations  limits ambulation, standing tolerance, transfers, bed mobility, ascending and descending stairs, difficulty fitting LB clothing and shoes. difficulty reaching feet (AROM) difficulty bathing    Repetition  Increases Symptoms    Special Tests  + Stemmer dorsal feet Lymphedema Life Impact Scale (LLIS) TBA next visit    Pain Onset  More than a month ago                           OT Education - 04/27/19 1256    Education Details  Cont Pt edu for simple self MLD. Pt able to perform short neck sequence with mod A, including verbal and tactile cues, by end  of session.    Person(s) Educated  Patient    Methods  Explanation;Demonstration;Handout;Tactile cues;Verbal cues    Comprehension  Verbalized understanding;Returned demonstration;Verbal cues required;Tactile cues required;Need further instruction          OT Long Term Goals - 04/12/19 1300      OT LONG TERM GOAL #1   Title  Pt will apply BLE, knee length, multi-layer, short stretch compression wraps daily using correct gradient techniques with min caregiver assistance     to achieve optimal limb volume reduction, to return affected limb , as closely as possible, to premorbid size and shape, to limit infection risk, and to improve safe functional ambulation and mobility.    Baseline  dependent    Time  4    Period  Days    Status  Achieved    Target Date  04/12/19      OT LONG TERM GOAL #2   Title  Pt to achieve at least 10% BLE limb volume reductions below the knees during Intensive Phase CDT to increase  standing / walking tolerance , to increase functional performance of basic and instrumental ADLs, and to limit LE progression.    Baseline  dependent    Time  12    Period  Weeks    Status  Partially Met    Target Date  --   for RLE A-D with 21.47%  vol reduction     OT LONG TERM GOAL #3   Title  Pt will be able to verbalize signs and symptoms of cellulitis infection and identify 4 common lymphedema precautions using printed resource for reference (modified independence) to limit LE progression over time .    Baseline  Max A    Period  Days    Status  Partially Met    Target Date  --   tardiness limits progress on this goal     OT LONG TERM GOAL #4   Title  Pt will achieve and sustain at least 85%  compliance with daily LE self-care home program (skin care, lymphatic pumping therex, compression and simple self-MLD) ) with min CG assistance during Intensive Phase CDT  to limit  limb swelling, reduce infection risk,  limit associated pain , and limit LE progression.    Baseline   Dependent    Time  12    Period  Weeks    Status  On-going    Target Date  --   Pt working towards this goal     OT LONG TERM GOAL #5   Title  Once issued Pt will be able to don and doff appropriate compression garments and/ or devices using correct techniques and assistive devices with extra time (modified independence) by end of Intensive Phase CDT for optimal LE management to limit progression over time.    Baseline  Max A    Time  12    Period  Weeks    Status  On-going    Target Date  --   unable to assess     OT LONG TERM GOAL #6   Title  During self-management phase of CDT Pt will retain limb volume reductions achieved during Intensive Phase CDT with no more than 3% volume increase to limit LE progression and further functional decline.    Baseline  Max A    Time  6    Period  Months    Status  On-going    Target Date  --   carry forward           Plan - 04/26/19 1257    Clinical Impression Statement  Wraps on leg appear to be same as those applied at last visit due to deep positive indentations   in skin where stretch net and fibrosis pad were applied to posterior area of fibrosis. Pt denies this stating that she reapplied wraps yesterday. Pt reminded again about the importance of removing and reapplying wrapas daily after carfully inspecting skin, bathing and applying low pH lotion. I'll continuwe closely monitoring skin condition. RLE appears to be reaching clinical plateau in swelling reduction. Skin continues to improve in term,s of hydration, mobility and fibrosis softening. We'll complete comparative limb volumetrics later in the week and consider completing custom RLE compression garemtn measurements.    OT Occupational Profile and History  Comprehensive Assessment- Review of records and extensive additional review of physical, cognitive, psychosocial history related to current functional performance    Occupational performance deficits (Please refer to evaluation  for details):  ADL's;Leisure;IADL's;Social Participation;Work;Other   parenting role, self esteme, body image   Body Structure / Function / Physical Skills  ADL;Obesity;Decreased knowledge of precautions;Balance;Decreased knowledge of use of DME;Flexibility;IADL;Pain;Skin integrity;Edema;Mobility;ROM;Gait    Rehab Potential  Good    Clinical Decision Making  Several  treatment options, min-mod task modification necessary    Comorbidities Affecting Occupational Performance:  Presence of comorbidities impacting occupational performance    Comorbidities impacting occupational performance description:  See SUBJECTIVE    Modification or Assistance to Complete Evaluation   Min-Moderate modification of tasks or assist with assess necessary to complete eval    OT Frequency  2x / week    OT Duration  12 weeks   and PRN   OT Treatment/Interventions  Self-care/ADL training;Manual lymph drainage;Coping strategies training;Patient/family education;Compression bandaging;Therapist, nutritional;Therapeutic activities;Manual Therapy;DME and/or AE instruction;Other (comment)   myofacial release, skin care   Plan  treat one leg at a time to limit fall risk. Fit with custom compression garments bilaterally needed to achieve correct containment and fit. Consider fitting with 32-chamber, sequential pneumatic compression device (Tactile Medical Flexitouch "pump" ). The Flexitouch SPCD is the only sequential device that duplicates proximal to distal lymphatic decongestion to return interstitial fluid through inguinal and abdominal lymphatics to the heart. A basic pneumatic device is not appropriate for this patient because it does not treat deep abdominal lymphatics essential for transporting lymphatic congestion to the thoracic duct to return to circulation.       Patient will benefit from skilled therapeutic intervention in order to improve the following deficits and impairments:   Body Structure / Function /  Physical Skills: ADL, Obesity, Decreased knowledge of precautions, Balance, Decreased knowledge of use of DME, Flexibility, IADL, Pain, Skin integrity, Edema, Mobility, ROM, Gait       Visit Diagnosis: Lymphedema, not elsewhere classified    Problem List Patient Active Problem List   Diagnosis Date Noted  . 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 Anxiety state 11/28/2009  . Depressive disorder 11/28/2009  . Essential hypertension 11/28/2009   Andrey Spearman, MS, OTR/L, [facility] 04/27/19 1:03 PM   [place] MAIN [facility] SERVICES [address] [place], [place], [address] Phone: [phone]   Fax:  [phone]  Name: Jaclyn Kramer MRN: [id number] Date of Birth: 04-24-1956

## 2019-04-28 ENCOUNTER — Other Ambulatory Visit: Payer: Self-pay

## 2019-04-28 ENCOUNTER — Ambulatory Visit: Payer: Self-pay | Admitting: Occupational Therapy

## 2019-04-28 DIAGNOSIS — I890 Lymphedema, not elsewhere classified: Secondary | ICD-10-CM

## 2019-04-28 NOTE — Therapy (Signed)
[place] MAIN [facility] SERVICES [address] [place], [place], [address] Phone: [phone]   Fax:  [phone]  Occupational Therapy Treatment  Patient Details  Name: Jaclyn Kramer MRN: [id number] Date of Birth: February 02, 1956 Referring Provider (OT): Linton Ham, MD   Encounter Date: 04/28/2019  OT End of Session - 04/28/19 1328    Visit Number  15    Number of Visits  36    Date for OT Re-Evaluation  06/08/19    OT Start Time  0116    Activity Tolerance  Patient tolerated treatment well;No increased pain    Behavior During Therapy  [facility] for tasks assessed/performed       No past medical history on file.  No past surgical history on file.  There were no vitals filed for this visit.  Subjective Assessment - 04/28/19 1325    Subjective   [name] presents for OT visit 14/36 to address BLE lymphedema. Pt presents 16 minutes late for a 60 min appointment. Pt presents with wraps in place. Pt presents with pain patch on her back for pulled muscle.    Patient is accompanied by:  Family member   mother, Jaclyn Kramer   Pertinent History  Hx anemia, HTN, anxiety disorder, major depressive disorder, hx non-adherance to medical treatment;    Limitations  limits ambulation, standing tolerance, transfers, bed mobility, ascending and descending stairs, difficulty fitting LB clothing and shoes. difficulty reaching feet (AROM) difficulty bathing    Repetition  Increases Symptoms    Special Tests  + Stemmer dorsal feet Lymphedema Life Impact Scale (LLIS) TBA next visit    Pain Onset  More than a month ago                   OT Treatments/Exercises (OP) - 04/28/19 0001      ADLs   ADL Education Given  Yes      Manual Therapy   Manual Therapy  Edema management;Compression Bandaging;Manual Lymphatic Drainage (MLD)    Compression Bandaging  LLE multilayer gradient compression wrap from base of toes to tibial tuberosity using 8, 10 and  12" short stretch wraps over single layer of 0.4 cm Rosidal foam and cotton stockinett. Circumferential wrap only today. Added 8 x 5" Komprex dot foam pad to posterior leg w lower edge anchored ! 2 " above ankle crease   with strtch net. Added this under stockinet et al in efforrt to reduce dense fibrosis.             OT Education - 04/28/19 1328    Education Details  Cont Pt edu for simple self MLD. Pt able to perform short neck sequence with mod A, including verbal and tactile cues, by end of session.    Person(s) Educated  Patient    Methods  Explanation;Demonstration;Handout;Tactile cues;Verbal cues    Comprehension  Verbalized understanding;Returned demonstration;Verbal cues required;Tactile cues required;Need further instruction          OT Long Term Goals - 04/12/19 1300      OT LONG TERM GOAL #1   Title  Pt will apply BLE, knee length, multi-layer, short stretch compression wraps daily using correct gradient techniques with min caregiver assistance     to achieve optimal limb volume reduction, to return affected limb , as closely as possible, to premorbid size and shape, to limit infection risk, and to improve safe functional ambulation and mobility.    Baseline  dependent    Time  4  Period  Days    Status  Achieved    Target Date  04/12/19      OT LONG TERM GOAL #2   Title  Pt to achieve at least 10% BLE limb volume reductions below the knees during Intensive Phase CDT to increase  standing / walking tolerance , to increase functional performance of basic and instrumental ADLs, and to limit LE progression.    Baseline  dependent    Time  12    Period  Weeks    Status  Partially Met    Target Date  --   for RLE A-D with 21.47%  vol reduction     OT LONG TERM GOAL #3   Title  Pt will be able to verbalize signs and symptoms of cellulitis infection and identify 4 common lymphedema precautions using printed resource for reference (modified independence) to limit LE  progression over time .    Baseline  Max A    Period  Days    Status  Partially Met    Target Date  --   tardiness limits progress on this goal     OT LONG TERM GOAL #4   Title  Pt will achieve and sustain at least 85%  compliance with daily LE self-care home program (skin care, lymphatic pumping therex, compression and simple self-MLD) ) with min CG assistance during Intensive Phase CDT  to limit  limb swelling, reduce infection risk,  limit associated pain , and limit LE progression.    Baseline  Dependent    Time  12    Period  Weeks    Status  On-going    Target Date  --   Pt working towards this goal     OT LONG TERM GOAL #5   Title  Once issued Pt will be able to don and doff appropriate compression garments and/ or devices using correct techniques and assistive devices with extra time (modified independence) by end of Intensive Phase CDT for optimal LE management to limit progression over time.    Baseline  Max A    Time  12    Period  Weeks    Status  On-going    Target Date  --   unable to assess     OT LONG TERM GOAL #6   Title  During self-management phase of CDT Pt will retain limb volume reductions achieved during Intensive Phase CDT with no more than 3% volume increase to limit LE progression and further functional decline.    Baseline  Max A    Time  6    Period  Months    Status  On-going    Target Date  --   carry forward           Plan - 04/28/19 1401    Clinical Impression Statement  Pt and OT completed and submitted [facility] application for custom compression knee highs. Pt and OT discuussed garment specifications and recommendations. MLD and compression wraps to RLE as established without difficulty. Cobnt as per POC. RLE garment measurements to be completed at next visit.    OT Occupational Profile and History  Comprehensive Assessment- Review of records and extensive additional review of physical, cognitive, psychosocial history  related to current functional performance    Occupational performance deficits (Please refer to evaluation for details):  ADL's;Leisure;IADL's;Social Participation;Work;Other   parenting role, self esteme, body image   Body Structure / Function / Physical Skills  ADL;Obesity;Decreased knowledge of precautions;Balance;Decreased  knowledge of use of DME;Flexibility;IADL;Pain;Skin integrity;Edema;Mobility;ROM;Gait    Rehab Potential  Good    Clinical Decision Making  Several treatment options, min-mod task modification necessary    Comorbidities Affecting Occupational Performance:  Presence of comorbidities impacting occupational performance    Comorbidities impacting occupational performance description:  See SUBJECTIVE    Modification or Assistance to Complete Evaluation   Min-Moderate modification of tasks or assist with assess necessary to complete eval    OT Frequency  2x / week    OT Duration  12 weeks   and PRN   OT Treatment/Interventions  Self-care/ADL training;Manual lymph drainage;Coping strategies training;Patient/family education;Compression bandaging;Therapist, nutritional;Therapeutic activities;Manual Therapy;DME and/or AE instruction;Other (comment)   myofacial release, skin care   Plan  treat one leg at a time to limit fall risk. Fit with custom compression garments bilaterally needed to achieve correct containment and fit. Consider fitting with 32-chamber, sequential pneumatic compression device (Tactile Medical Flexitouch "pump" ). The Flexitouch SPCD is the only sequential device that duplicates proximal to distal lymphatic decongestion to return interstitial fluid through inguinal and abdominal lymphatics to the heart. A basic pneumatic device is not appropriate for this patient because it does not treat deep abdominal lymphatics essential for transporting lymphatic congestion to the thoracic duct to return to circulation.       Patient will benefit from skilled therapeutic  intervention in order to improve the following deficits and impairments:   Body Structure / Function / Physical Skills: ADL, Obesity, Decreased knowledge of precautions, Balance, Decreased knowledge of use of DME, Flexibility, IADL, Pain, Skin integrity, Edema, Mobility, ROM, Gait       Visit Diagnosis: Lymphedema, not elsewhere classified    Problem List Patient Active Problem List   Diagnosis Date Noted  . 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 Anxiety state 11/28/2009  . Depressive disorder 11/28/2009  . Essential hypertension 11/28/2009    Andrey Spearman, MS, OTR/L, [facility] 04/28/19 2:03 PM  [place] MAIN [facility] SERVICES [address] [place], [place], [address] Phone: [phone]   Fax:  [phone]  Name: Presleigh Feldstein MRN: [id number] Date of Birth: August 24, 1955

## 2019-04-28 NOTE — Therapy (Signed)
[place] MAIN [facility] SERVICES [address] [place], [place], [address] Phone: [phone]   Fax:  [phone]  Occupational Therapy Treatment  Patient Details  Name: Jaclyn Kramer MRN: [id number] Date of Birth: [birth year] Referring Provider (OT): Linton Ham, MD   Encounter Date: 04/28/2019  OT End of Session - 04/28/19 1328    Visit Number  15    Number of Visits  36    Date for OT Re-Evaluation  06/08/19    OT Start Time  0116    OT Stop Time  0205    OT Time Calculation (min)  49 min    Activity Tolerance  Patient tolerated treatment well;No increased pain    Behavior During Therapy  [facility] for tasks assessed/performed       No past medical history on file.  No past surgical history on file.  There were no vitals filed for this visit.  Subjective Assessment - 04/28/19 1325    Subjective   Brad Mcgaughy presents for OT visit 14/36 to address BLE lymphedema. Pt presents 16 minutes late for a 60 min appointment. Pt presents with wraps in place. Pt presents with pain patch on her back for pulled muscle.    Patient is accompanied by:  Family member   mother, Corie Allis   Pertinent History  Hx anemia, HTN, anxiety disorder, major depressive disorder, hx non-adherance to medical treatment;    Limitations  limits ambulation, standing tolerance, transfers, bed mobility, ascending and descending stairs, difficulty fitting LB clothing and shoes. difficulty reaching feet (AROM) difficulty bathing    Repetition  Increases Symptoms    Special Tests  + Stemmer dorsal feet Lymphedema Life Impact Scale (LLIS) TBA next visit    Pain Onset  More than a month ago                   OT Treatments/Exercises (OP) - 04/28/19 0001      ADLs   ADL Education Given  Yes      Manual Therapy   Manual Therapy  Edema management;Compression Bandaging;Manual Lymphatic Drainage (MLD)    Compression Bandaging  LLE multilayer gradient compression  wrap from base of toes to tibial tuberosity using 8, 10 and 12" short stretch wraps over single layer of 0.4 cm Rosidal foam and cotton stockinett. Circumferential wrap only today. Added 8 x 5" Komprex dot foam pad to posterior leg w lower edge anchored ! 2 " above ankle crease   with strtch net. Added this under stockinet et al in efforrt to reduce dense fibrosis.             OT Education - 04/28/19 1328    Education Details  Cont Pt edu for simple self MLD. Pt able to perform short neck sequence with mod A, including verbal and tactile cues, by end of session.    Person(s) Educated  Patient    Methods  Explanation;Demonstration;Handout;Tactile cues;Verbal cues    Comprehension  Verbalized understanding;Returned demonstration;Verbal cues required;Tactile cues required;Need further instruction          OT Long Term Goals - 04/12/19 1300      OT LONG TERM GOAL #1   Title  Pt will apply BLE, knee length, multi-layer, short stretch compression wraps daily using correct gradient techniques with min caregiver assistance     to achieve optimal limb volume reduction, to return affected limb , as closely as possible, to premorbid size and shape, to limit infection risk, and to  improve safe functional ambulation and mobility.    Baseline  dependent    Time  4    Period  Days    Status  Achieved    Target Date  04/12/19      OT LONG TERM GOAL #2   Title  Pt to achieve at least 10% BLE limb volume reductions below the knees during Intensive Phase CDT to increase  standing / walking tolerance , to increase functional performance of basic and instrumental ADLs, and to limit LE progression.    Baseline  dependent    Time  12    Period  Weeks    Status  Partially Met    Target Date  --   for RLE A-D with 21.47%  vol reduction     OT LONG TERM GOAL #3   Title  Pt will be able to verbalize signs and symptoms of cellulitis infection and identify 4 common lymphedema precautions using printed  resource for reference (modified independence) to limit LE progression over time .    Baseline  Max A    Period  Days    Status  Partially Met    Target Date  --   tardiness limits progress on this goal     OT LONG TERM GOAL #4   Title  Pt will achieve and sustain at least 85%  compliance with daily LE self-care home program (skin care, lymphatic pumping therex, compression and simple self-MLD) ) with min CG assistance during Intensive Phase CDT  to limit  limb swelling, reduce infection risk,  limit associated pain , and limit LE progression.    Baseline  Dependent    Time  12    Period  Weeks    Status  On-going    Target Date  --   Pt working towards this goal     OT LONG TERM GOAL #5   Title  Once issued Pt will be able to don and doff appropriate compression garments and/ or devices using correct techniques and assistive devices with extra time (modified independence) by end of Intensive Phase CDT for optimal LE management to limit progression over time.    Baseline  Max A    Time  12    Period  Weeks    Status  On-going    Target Date  --   unable to assess     OT LONG TERM GOAL #6   Title  During self-management phase of CDT Pt will retain limb volume reductions achieved during Intensive Phase CDT with no more than 3% volume increase to limit LE progression and further functional decline.    Baseline  Max A    Time  6    Period  Months    Status  On-going    Target Date  --   carry forward           Plan - 04/28/19 1401    Clinical Impression Statement  Pt and OT completed and submitted [facility] application for custom compression knee highs. Pt and OT discuussed garment specifications and recommendations. MLD and compression wraps to RLE as established without difficulty. Cobnt as per POC. RLE garment measurements to be completed at next visit.    OT Occupational Profile and History  Comprehensive Assessment- Review of records and extensive  additional review of physical, cognitive, psychosocial history related to current functional performance    Occupational performance deficits (Please refer to evaluation for details):  ADL's;Leisure;IADL's;Social Participation;Work;Other  parenting role, self esteme, body image   Body Structure / Function / Physical Skills  ADL;Obesity;Decreased knowledge of precautions;Balance;Decreased knowledge of use of DME;Flexibility;IADL;Pain;Skin integrity;Edema;Mobility;ROM;Gait    Rehab Potential  Good    Clinical Decision Making  Several treatment options, min-mod task modification necessary    Comorbidities Affecting Occupational Performance:  Presence of comorbidities impacting occupational performance    Comorbidities impacting occupational performance description:  See SUBJECTIVE    Modification or Assistance to Complete Evaluation   Min-Moderate modification of tasks or assist with assess necessary to complete eval    OT Frequency  2x / week    OT Duration  12 weeks   and PRN   OT Treatment/Interventions  Self-care/ADL training;Manual lymph drainage;Coping strategies training;Patient/family education;Compression bandaging;Therapist, nutritional;Therapeutic activities;Manual Therapy;DME and/or AE instruction;Other (comment)   myofacial release, skin care   Plan  treat one leg at a time to limit fall risk. Fit with custom compression garments bilaterally needed to achieve correct containment and fit. Consider fitting with 32-chamber, sequential pneumatic compression device (Tactile Medical Flexitouch "pump" ). The Flexitouch SPCD is the only sequential device that duplicates proximal to distal lymphatic decongestion to return interstitial fluid through inguinal and abdominal lymphatics to the heart. A basic pneumatic device is not appropriate for this patient because it does not treat deep abdominal lymphatics essential for transporting lymphatic congestion to the thoracic duct to return to  circulation.       Patient will benefit from skilled therapeutic intervention in order to improve the following deficits and impairments:   Body Structure / Function / Physical Skills: ADL, Obesity, Decreased knowledge of precautions, Balance, Decreased knowledge of use of DME, Flexibility, IADL, Pain, Skin integrity, Edema, Mobility, ROM, Gait       Visit Diagnosis: Lymphedema, not elsewhere classified    Problem List Patient Active Problem List   Diagnosis Date Noted  . 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 Anxiety state 11/28/2009  . Depressive disorder 11/28/2009  . Essential hypertension 11/28/2009    Andrey Spearman, MS, OTR/L, [facility] 04/28/19 2:05 PM   [place] MAIN [facility] SERVICES [address] [place], [place], [address] Phone: [phone]   Fax:  [phone]  Name: Jaclyn Kramer MRN: [id number] Date of Birth: 04/09/1956

## 2019-04-29 ENCOUNTER — Ambulatory Visit: Payer: Self-pay | Admitting: Occupational Therapy

## 2019-04-29 ENCOUNTER — Other Ambulatory Visit: Payer: Self-pay

## 2019-04-29 DIAGNOSIS — I890 Lymphedema, not elsewhere classified: Secondary | ICD-10-CM

## 2019-04-29 NOTE — Therapy (Signed)
[place] MAIN [facility] SERVICES [address] [place], [place], [address] Phone: [phone]   Fax:  [phone]  Occupational Therapy Treatment  Patient Details  Name: Jaclyn Kramer MRN: [id number] Date of Birth: 1955-10-14 Referring Provider (OT): Linton Ham, MD   Encounter Date: 04/29/2019  OT End of Session - 04/29/19 1594    Visit Number  16    Number of Visits  36    Date for OT Re-Evaluation  06/08/19    OT Start Time  0115    OT Stop Time  0200    OT Time Calculation (min)  45 min    Activity Tolerance  Patient tolerated treatment well;No increased pain    Behavior During Therapy  [facility] for tasks assessed/performed       No past medical history on file.  No past surgical history on file.  There were no vitals filed for this visit.  Subjective Assessment - 04/29/19 1322    Subjective   Jaclyn Kramer presents for OT visit 15/36 to address BLE lymphedema. Pt presents 15 minutes late for a 60 min appointment. Pt presents with wraps in place. Pt presents with pain patch on her back for pulled muscle.    Patient is accompanied by:  Family member   mother, Jaclyn Kramer   Pertinent History  Hx anemia, HTN, anxiety disorder, major depressive disorder, hx non-adherance to medical treatment;    Limitations  limits ambulation, standing tolerance, transfers, bed mobility, ascending and descending stairs, difficulty fitting LB clothing and shoes. difficulty reaching feet (AROM) difficulty bathing    Repetition  Increases Symptoms    Special Tests  + Stemmer dorsal feet Lymphedema Life Impact Scale (LLIS) TBA next visit    Currently in Pain?  Yes    Pain Score  7     Pain Orientation  Right    Pain Descriptors / Indicators  Sore    Pain Onset  More than a month ago                   OT Treatments/Exercises (OP) - 04/29/19 0001      ADLs   ADL Education Given  Yes      Manual Therapy   Manual Therapy  Edema management     Manual therapy comments  RLE anatomical measurements for custom Elvarex knee highs    Manual Lymphatic Drainage (MLD)  MLD to RLE/ RLQ utilizing short neck  sequence, deep  abdominal pathways and functional inguinal LN.    Compression Bandaging  LLE multilayer gradient compression wrap from base of toes to tibial tuberosity using 8, 10 and 12" short stretch wraps over single layer of 0.4 cm Rosidal foam and cotton stockinett. Circumferential wrap only today. Added 8 x 5" Komprex dot foam pad to posterior leg w lower edge anchored ! 2 " above ankle crease   with strtch net. Added this under stockinet et al in efforrt to reduce dense fibrosis.             OT Education - 04/29/19 1325    Education Details  Pt edu for compression garment specifications, recommendations, measuring and fitting process.    Person(s) Educated  Patient    Methods  Explanation;Demonstration;Handout;Tactile cues;Verbal cues    Comprehension  Verbalized understanding;Returned demonstration;Verbal cues required;Tactile cues required;Need further instruction          OT Long Term Goals - 04/12/19 1300      OT LONG TERM GOAL #1  Title  Pt will apply BLE, knee length, multi-layer, short stretch compression wraps daily using correct gradient techniques with min caregiver assistance     to achieve optimal limb volume reduction, to return affected limb , as closely as possible, to premorbid size and shape, to limit infection risk, and to improve safe functional ambulation and mobility.    Baseline  dependent    Time  4    Period  Days    Status  Achieved    Target Date  04/12/19      OT LONG TERM GOAL #2   Title  Pt to achieve at least 10% BLE limb volume reductions below the knees during Intensive Phase CDT to increase  standing / walking tolerance , to increase functional performance of basic and instrumental ADLs, and to limit LE progression.    Baseline  dependent    Time  12    Period  Weeks    Status   Partially Met    Target Date  --   for RLE A-D with 21.47%  vol reduction     OT LONG TERM GOAL #3   Title  Pt will be able to verbalize signs and symptoms of cellulitis infection and identify 4 common lymphedema precautions using printed resource for reference (modified independence) to limit LE progression over time .    Baseline  Max A    Period  Days    Status  Partially Met    Target Date  --   tardiness limits progress on this goal     OT LONG TERM GOAL #4   Title  Pt will achieve and sustain at least 85%  compliance with daily LE self-care home program (skin care, lymphatic pumping therex, compression and simple self-MLD) ) with min CG assistance during Intensive Phase CDT  to limit  limb swelling, reduce infection risk,  limit associated pain , and limit LE progression.    Baseline  Dependent    Time  12    Period  Weeks    Status  On-going    Target Date  --   Pt working towards this goal     OT LONG TERM GOAL #5   Title  Once issued Pt will be able to don and doff appropriate compression garments and/ or devices using correct techniques and assistive devices with extra time (modified independence) by end of Intensive Phase CDT for optimal LE management to limit progression over time.    Baseline  Max A    Time  12    Period  Weeks    Status  On-going    Target Date  --   unable to assess     OT LONG TERM GOAL #6   Title  During self-management phase of CDT Pt will retain limb volume reductions achieved during Intensive Phase CDT with no more than 3% volume increase to limit LE progression and further functional decline.    Baseline  Max A    Time  6    Period  Months    Status  On-going    Target Date  --   carry forward           Plan - 04/29/19 1437    Clinical Impression Statement  Completed anatomical measurements for custom, RLE compression knee high-Elvarex classic, ccl 3 (25-35 mmHg). Reapplied cmpression wraps. Cont as per POC.    OT Occupational  Profile and History  Comprehensive Assessment- Review of records and extensive additional  review of physical, cognitive, psychosocial history related to current functional performance    Occupational performance deficits (Please refer to evaluation for details):  ADL's;Leisure;IADL's;Social Participation;Work;Other   parenting role, self esteme, body image   Body Structure / Function / Physical Skills  ADL;Obesity;Decreased knowledge of precautions;Balance;Decreased knowledge of use of DME;Flexibility;IADL;Pain;Skin integrity;Edema;Mobility;ROM;Gait    Rehab Potential  Good    Clinical Decision Making  Several treatment options, min-mod task modification necessary    Comorbidities Affecting Occupational Performance:  Presence of comorbidities impacting occupational performance    Comorbidities impacting occupational performance description:  See SUBJECTIVE    Modification or Assistance to Complete Evaluation   Min-Moderate modification of tasks or assist with assess necessary to complete eval    OT Frequency  2x / week    OT Duration  12 weeks   and PRN   OT Treatment/Interventions  Self-care/ADL training;Manual lymph drainage;Coping strategies training;Patient/family education;Compression bandaging;Therapist, nutritional;Therapeutic activities;Manual Therapy;DME and/or AE instruction;Other (comment)   myofacial release, skin care   Plan  treat one leg at a time to limit fall risk. Fit with custom compression garments bilaterally needed to achieve correct containment and fit. Consider fitting with 32-chamber, sequential pneumatic compression device (Tactile Medical Flexitouch "pump" ). The Flexitouch SPCD is the only sequential device that duplicates proximal to distal lymphatic decongestion to return interstitial fluid through inguinal and abdominal lymphatics to the heart. A basic pneumatic device is not appropriate for this patient because it does not treat deep abdominal lymphatics essential  for transporting lymphatic congestion to the thoracic duct to return to circulation.       Patient will benefit from skilled therapeutic intervention in order to improve the following deficits and impairments:   Body Structure / Function / Physical Skills: ADL, Obesity, Decreased knowledge of precautions, Balance, Decreased knowledge of use of DME, Flexibility, IADL, Pain, Skin integrity, Edema, Mobility, ROM, Gait       Visit Diagnosis: Lymphedema, not elsewhere classified    Problem List Patient Active Problem List   Diagnosis Date Noted  . 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 Anxiety state 11/28/2009  . Depressive disorder 11/28/2009  . Essential hypertension 11/28/2009   Andrey Spearman, MS, OTR/L, [facility] 04/29/19 2:44 PM   [place] MAIN [facility] SERVICES [address] [place], [place], [address] Phone: [phone]   Fax:  [phone]  Name: Analyce Tavares MRN: [id number] Date of Birth: [birth year]

## 2019-05-03 ENCOUNTER — Other Ambulatory Visit: Payer: Self-pay

## 2019-05-03 ENCOUNTER — Ambulatory Visit: Payer: Self-pay | Admitting: Occupational Therapy

## 2019-05-03 DIAGNOSIS — I890 Lymphedema, not elsewhere classified: Secondary | ICD-10-CM

## 2019-05-03 NOTE — Therapy (Signed)
[place] MAIN [facility] SERVICES [address] [place], [place], [address] Phone: [phone]   Fax:  [phone]  Occupational Therapy Treatment  Patient Details  Name: Jaclyn Kramer MRN: [id number] Date of Birth: 16-Apr-1956 Referring Provider (OT): Linton Ham, MD   Encounter Date: 05/03/2019  OT End of Session - 05/03/19 1524    Visit Number  17       No past medical history on file.  No past surgical history on file.  There were no vitals filed for this visit.  Subjective Assessment - 05/03/19 1124    Subjective   [name] presents for OT visit 16/36 to address BLE lymphedema. Pt presents 8 minutes late for a 60 min appointment. Pt presents with wraps in place. Pt has no new complaints.    Patient is accompanied by:  Family member   mother, Isadore Bokhari   Pertinent History  Hx anemia, HTN, anxiety disorder, major depressive disorder, hx non-adherance to medical treatment;    Limitations  limits ambulation, standing tolerance, transfers, bed mobility, ascending and descending stairs, difficulty fitting LB clothing and shoes. difficulty reaching feet (AROM) difficulty bathing    Repetition  Increases Symptoms    Special Tests  + Stemmer dorsal feet Lymphedema Life Impact Scale (LLIS) TBA next visit    Pain Onset  More than a month ago                   OT Treatments/Exercises (OP) - 05/03/19 0001      ADLs   ADL Education Given  Yes      Manual Therapy   Manual Therapy  Edema management;Manual Lymphatic Drainage (MLD);Compression Bandaging    Manual therapy comments  --    Edema Management  skin care    Manual Lymphatic Drainage (MLD)  MLD to RLE/ RLQ utilizing short neck  sequence, deep  abdominal pathways and functional inguinal LN.    Compression Bandaging  LLE multilayer gradient compression wrap from base of toes to tibial tuberosity using 8, 10 and 12" short stretch wraps over single layer of 0.4 cm Rosidal  foam and cotton stockinett. Circumferential wrap only today. Added 8 x 5" Komprex dot foam pad to posterior leg w lower edge anchored ! 2 " above ankle crease   with strtch net. Added this under stockinet et al in efforrt to reduce dense fibrosis.             OT Education - 05/03/19 1202    Education Details  Continued skilled Pt/caregiver education  And LE ADL training throughout visit for lymphedema self care/ home program, including compression wrapping, compression garment and device wear/care, lymphatic pumping ther ex, simple self-MLD, and skin care. Discussed progress towards goals.    Person(s) Educated  Patient    Methods  Explanation;Demonstration;Handout;Tactile cues;Verbal cues    Comprehension  Verbalized understanding;Returned demonstration;Verbal cues required;Tactile cues required;Need further instruction          OT Long Term Goals - 04/12/19 1300      OT LONG TERM GOAL #1   Title  Pt will apply BLE, knee length, multi-layer, short stretch compression wraps daily using correct gradient techniques with min caregiver assistance     to achieve optimal limb volume reduction, to return affected limb , as closely as possible, to premorbid size and shape, to limit infection risk, and to improve safe functional ambulation and mobility.    Baseline  dependent    Time  4  Period  Days    Status  Achieved    Target Date  04/12/19      OT LONG TERM GOAL #2   Title  Pt to achieve at least 10% BLE limb volume reductions below the knees during Intensive Phase CDT to increase  standing / walking tolerance , to increase functional performance of basic and instrumental ADLs, and to limit LE progression.    Baseline  dependent    Time  12    Period  Weeks    Status  Partially Met    Target Date  --   for RLE A-D with 21.47%  vol reduction     OT LONG TERM GOAL #3   Title  Pt will be able to verbalize signs and symptoms of cellulitis infection and identify 4 common lymphedema  precautions using printed resource for reference (modified independence) to limit LE progression over time .    Baseline  Max A    Period  Days    Status  Partially Met    Target Date  --   tardiness limits progress on this goal     OT LONG TERM GOAL #4   Title  Pt will achieve and sustain at least 85%  compliance with daily LE self-care home program (skin care, lymphatic pumping therex, compression and simple self-MLD) ) with min CG assistance during Intensive Phase CDT  to limit  limb swelling, reduce infection risk,  limit associated pain , and limit LE progression.    Baseline  Dependent    Time  12    Period  Weeks    Status  On-going    Target Date  --   Pt working towards this goal     OT LONG TERM GOAL #5   Title  Once issued Pt will be able to don and doff appropriate compression garments and/ or devices using correct techniques and assistive devices with extra time (modified independence) by end of Intensive Phase CDT for optimal LE management to limit progression over time.    Baseline  Max A    Time  12    Period  Weeks    Status  On-going    Target Date  --   unable to assess     OT LONG TERM GOAL #6   Title  During self-management phase of CDT Pt will retain limb volume reductions achieved during Intensive Phase CDT with no more than 3% volume increase to limit LE progression and further functional decline.    Baseline  Max A    Time  6    Period  Months    Status  On-going    Target Date  --   carry forward           Plan - 05/03/19 1203    Clinical Impression Statement  Provided MLD, skin care , compression wrapping, and Pt edu .Pt tolerted all aspects of OT without difficulty. Skin condition contnues to improve with improved hydration  and decreased density. Pt tolerated MLD and fibrosis technique to  Cont as per POC.    OT Occupational Profile and History  Comprehensive Assessment- Review of records and extensive additional review of physical, cognitive,  psychosocial history related to current functional performance    Occupational performance deficits (Please refer to evaluation for details):  ADL's;Leisure;IADL's;Social Participation;Work;Other   parenting role, self esteme, body image   Body Structure / Function / Physical Skills  ADL;Obesity;Decreased knowledge of precautions;Balance;Decreased knowledge of use of  DME;Flexibility;IADL;Pain;Skin integrity;Edema;Mobility;ROM;Gait    Rehab Potential  Good    Clinical Decision Making  Several treatment options, min-mod task modification necessary    Comorbidities Affecting Occupational Performance:  Presence of comorbidities impacting occupational performance    Comorbidities impacting occupational performance description:  See SUBJECTIVE    Modification or Assistance to Complete Evaluation   Min-Moderate modification of tasks or assist with assess necessary to complete eval    OT Frequency  2x / week    OT Duration  12 weeks   and PRN   OT Treatment/Interventions  Self-care/ADL training;Manual lymph drainage;Coping strategies training;Patient/family education;Compression bandaging;Therapist, nutritional;Therapeutic activities;Manual Therapy;DME and/or AE instruction;Other (comment)   myofacial release, skin care   Plan  treat one leg at a time to limit fall risk. Fit with custom compression garments bilaterally needed to achieve correct containment and fit. Consider fitting with 32-chamber, sequential pneumatic compression device ([employer] Flexitouch "pump" ). The Flexitouch SPCD is the only sequential device that duplicates proximal to distal lymphatic decongestion to return interstitial fluid through inguinal and abdominal lymphatics to the heart. A basic pneumatic device is not appropriate for this patient because it does not treat deep abdominal lymphatics essential for transporting lymphatic congestion to the thoracic duct to return to circulation.       Patient will benefit from  skilled therapeutic intervention in order to improve the following deficits and impairments:   Body Structure / Function / Physical Skills: ADL, Obesity, Decreased knowledge of precautions, Balance, Decreased knowledge of use of DME, Flexibility, IADL, Pain, Skin integrity, Edema, Mobility, ROM, Gait       Visit Diagnosis: Lymphedema, not elsewhere classified    Problem List Patient Active Problem List   Diagnosis Date Noted  . 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 Anxiety state 11/28/2009  . Depressive disorder 11/28/2009  . Essential hypertension 11/28/2009   Andrey Spearman, MS, OTR/L, [facility] 05/03/19 3:28 PM   [place] MAIN [facility] SERVICES [address] [place], [place], [address] Phone: [phone]   Fax:  [phone]  Name: Macey Wurtz MRN: [id number] Date of Birth: 1956/05/30

## 2019-05-05 ENCOUNTER — Ambulatory Visit: Payer: Self-pay | Admitting: Occupational Therapy

## 2019-05-10 ENCOUNTER — Other Ambulatory Visit: Payer: Self-pay

## 2019-05-10 ENCOUNTER — Ambulatory Visit: Payer: Self-pay | Admitting: Occupational Therapy

## 2019-05-10 DIAGNOSIS — I890 Lymphedema, not elsewhere classified: Secondary | ICD-10-CM

## 2019-05-10 NOTE — Therapy (Signed)
[place] MAIN [facility] SERVICES [address] [place], [place], [address] Phone: [phone]   Fax:  [phone]  Occupational Therapy Treatment  Patient Details  Name: Jaclyn Kramer MRN: [id number] Date of Birth: 1955/08/27 Referring Provider (OT): Linton Ham, MD   Encounter Date: 05/10/2019  OT End of Session - 05/10/19 1555    Visit Number  18    OT Start Time  0110    OT Stop Time  0210    OT Time Calculation (min)  60 min       No past medical history on file.  No past surgical history on file.  There were no vitals filed for this visit.  Subjective Assessment - 05/10/19 1320    Subjective   Jaclyn Kramer presents for OT visit 18/36 to address BLE lymphedema.  Pt presents with wraps in place. Pt has no new complaints. She denies pain ni her legs. Discussed with Pt  that OT is exploring availability of SPCD from [employer] vendor in effort to assist her with long term E self management.    Patient is accompanied by:  Family member   mother, Jaclyn Kramer   Pertinent History  Hx anemia, HTN, anxiety disorder, major depressive disorder, hx non-adherance to medical treatment;    Limitations  limits ambulation, standing tolerance, transfers, bed mobility, ascending and descending stairs, difficulty fitting LB clothing and shoes. difficulty reaching feet (AROM) difficulty bathing    Repetition  Increases Symptoms    Special Tests  + Stemmer dorsal feet Lymphedema Life Impact Scale (LLIS) TBA next visit    Pain Onset  More than a month ago                   OT Treatments/Exercises (OP) - 05/10/19 0001      ADLs   ADL Education Given  Yes      Manual Therapy   Manual Therapy  Edema management    Edema Management  skin care    Manual Lymphatic Drainage (MLD)  MLD to RLE/ RLQ utilizing short neck  sequence, deep  abdominal pathways and functional inguinal LN.    Compression Bandaging  LLE multilayer gradient  compression wrap from base of toes to tibial tuberosity using 8, 10 and 12" short stretch wraps over single layer of 0.4 cm Rosidal foam and cotton stockinett. Circumferential wrap only today. Added 8 x 5" Komprex dot foam pad to posterior leg w lower edge anchored ! 2 " above ankle crease   with strtch net. Added this under stockinet et al in efforrt to reduce dense fibrosis.             OT Education - 05/10/19 1555    Education Details  Continued skilled Pt/caregiver education  And LE ADL training throughout visit for lymphedema self care/ home program, including compression wrapping, compression garment and device wear/care, lymphatic pumping ther ex, simple self-MLD, and skin care. Discussed progress towards goals.    Person(s) Educated  Patient    Methods  Explanation;Demonstration;Handout;Tactile cues;Verbal cues    Comprehension  Verbalized understanding;Returned demonstration;Verbal cues required;Tactile cues required;Need further instruction          OT Long Term Goals - 04/12/19 1300      OT LONG TERM GOAL #1   Title  Pt will apply BLE, knee length, multi-layer, short stretch compression wraps daily using correct gradient techniques with min caregiver assistance     to achieve optimal limb volume reduction, to return  affected limb , as closely as possible, to premorbid size and shape, to limit infection risk, and to improve safe functional ambulation and mobility.    Baseline  dependent    Time  4    Period  Days    Status  Achieved    Target Date  04/12/19      OT LONG TERM GOAL #2   Title  Pt to achieve at least 10% BLE limb volume reductions below the knees during Intensive Phase CDT to increase  standing / walking tolerance , to increase functional performance of basic and instrumental ADLs, and to limit LE progression.    Baseline  dependent    Time  12    Period  Weeks    Status  Partially Met    Target Date  --   for RLE A-D with 21.47%  vol reduction     OT  LONG TERM GOAL #3   Title  Pt will be able to verbalize signs and symptoms of cellulitis infection and identify 4 common lymphedema precautions using printed resource for reference (modified independence) to limit LE progression over time .    Baseline  Max A    Period  Days    Status  Partially Met    Target Date  --   tardiness limits progress on this goal     OT LONG TERM GOAL #4   Title  Pt will achieve and sustain at least 85%  compliance with daily LE self-care home program (skin care, lymphatic pumping therex, compression and simple self-MLD) ) with min CG assistance during Intensive Phase CDT  to limit  limb swelling, reduce infection risk,  limit associated pain , and limit LE progression.    Baseline  Dependent    Time  12    Period  Weeks    Status  On-going    Target Date  --   Pt working towards this goal     OT LONG TERM GOAL #5   Title  Once issued Pt will be able to don and doff appropriate compression garments and/ or devices using correct techniques and assistive devices with extra time (modified independence) by end of Intensive Phase CDT for optimal LE management to limit progression over time.    Baseline  Max A    Time  12    Period  Weeks    Status  On-going    Target Date  --   unable to assess     OT LONG TERM GOAL #6   Title  During self-management phase of CDT Pt will retain limb volume reductions achieved during Intensive Phase CDT with no more than 3% volume increase to limit LE progression and further functional decline.    Baseline  Max A    Time  6    Period  Months    Status  On-going    Target Date  --   carry forward           Plan - 05/10/19 1556    Clinical Impression Statement  Pt tolerated RLE MLD, skin care with castor oil and reapplication of gradient compression wraps after manual therapy. Moleskin patch added to small, pea sized growth on bottom of the R foot to relieve pain. Pt educated re recommended, advanced,  sequential  pmeumatic compression  device, otherwise known as a LE "pump" from [employer]. Pt OK'd sharing demographics with manufacturer's rep to explore availability of doinated device for uninsured Pt. Cont  as per POC. Condityion of RLE continues to improve in terms of swelling reduction and tissue softening, and Pt demonstrates progress towards all OT goals. Cont as per POC.    OT Occupational Profile and History  Comprehensive Assessment- Review of records and extensive additional review of physical, cognitive, psychosocial history related to current functional performance    Occupational performance deficits (Please refer to evaluation for details):  ADL's;Leisure;IADL's;Social Participation;Work;Other   parenting role, self esteme, body image   Body Structure / Function / Physical Skills  ADL;Obesity;Decreased knowledge of precautions;Balance;Decreased knowledge of use of DME;Flexibility;IADL;Pain;Skin integrity;Edema;Mobility;ROM;Gait    Rehab Potential  Good    Clinical Decision Making  Several treatment options, min-mod task modification necessary    Comorbidities Affecting Occupational Performance:  Presence of comorbidities impacting occupational performance    Comorbidities impacting occupational performance description:  See SUBJECTIVE    Modification or Assistance to Complete Evaluation   Min-Moderate modification of tasks or assist with assess necessary to complete eval    OT Frequency  2x / week    OT Duration  12 weeks   and PRN   OT Treatment/Interventions  Self-care/ADL training;Manual lymph drainage;Coping strategies training;Patient/family education;Compression bandaging;Therapist, nutritional;Therapeutic activities;Manual Therapy;DME and/or AE instruction;Other (comment)   myofacial release, skin care   Plan  treat one leg at a time to limit fall risk. Fit with custom compression garments bilaterally needed to achieve correct containment and fit. Consider fitting with 32-chamber,  sequential pneumatic compression device ([employer] Flexitouch "pump" ). The Flexitouch SPCD is the only sequential device that duplicates proximal to distal lymphatic decongestion to return interstitial fluid through inguinal and abdominal lymphatics to the heart. A basic pneumatic device is not appropriate for this patient because it does not treat deep abdominal lymphatics essential for transporting lymphatic congestion to the thoracic duct to return to circulation.       Patient will benefit from skilled therapeutic intervention in order to improve the following deficits and impairments:   Body Structure / Function / Physical Skills: ADL, Obesity, Decreased knowledge of precautions, Balance, Decreased knowledge of use of DME, Flexibility, IADL, Pain, Skin integrity, Edema, Mobility, ROM, Gait       Visit Diagnosis: Lymphedema, not elsewhere classified    Problem List Patient Active Problem List   Diagnosis Date Noted  . 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 Anxiety state 11/28/2009  . Depressive disorder 11/28/2009  . Essential hypertension 11/28/2009    Andrey Spearman, MS, OTR/L, [facility] 05/10/19 4:02 PM  [place] MAIN [facility] SERVICES [address] [place], [place], [address] Phone: [phone]   Fax:  [phone]  Name: Ahuva Poynor MRN: [id number] Date of Birth: November 19, 1955

## 2019-05-12 ENCOUNTER — Other Ambulatory Visit: Payer: Self-pay

## 2019-05-12 ENCOUNTER — Ambulatory Visit: Payer: Self-pay | Attending: Internal Medicine | Admitting: Occupational Therapy

## 2019-05-12 DIAGNOSIS — I890 Lymphedema, not elsewhere classified: Secondary | ICD-10-CM | POA: Insufficient documentation

## 2019-05-12 NOTE — Therapy (Signed)
[place] MAIN [facility] SERVICES [address] [place], [place], [address] Phone: [phone]   Fax:  [phone]  Occupational Therapy Treatment  Patient Details  Name: Jaclyn Kramer MRN: [id number] Date of Birth: 02-09-1956 Referring Provider (OT): Linton Ham, MD   Encounter Date: 05/12/2019  OT End of Session - 05/12/19 1632    Visit Number  19    Number of Visits  36    Date for OT Re-Evaluation  06/08/19    OT Start Time  0116    OT Stop Time  0210    OT Time Calculation (min)  54 min    Activity Tolerance  Patient tolerated treatment well;No increased pain    Behavior During Therapy  [facility] for tasks assessed/performed       No past medical history on file.  No past surgical history on file.  There were no vitals filed for this visit.  Subjective Assessment - 05/12/19 1632    Subjective   Jaclyn Kramer presents for OT visit 18/36 to address BLE lymphedema.  Pt presents with wraps in place. Pt has no new complaints. She denies pain ni her legs. Discussed with Pt  that OT is exploring availability of SPCD from [employer] vendor in effort to assist her with long term E self management.    Patient is accompanied by:  Family member   mother, Jaclyn Kramer   Pertinent History  Hx anemia, HTN, anxiety disorder, major depressive disorder, hx non-adherance to medical treatment;    Limitations  limits ambulation, standing tolerance, transfers, bed mobility, ascending and descending stairs, difficulty fitting LB clothing and shoes. difficulty reaching feet (AROM) difficulty bathing    Repetition  Increases Symptoms    Special Tests  + Stemmer dorsal feet Lymphedema Life Impact Scale (LLIS) TBA next visit    Pain Onset  More than a month ago                           OT Education - 05/12/19 1632    Education Details  Continued skilled Pt/caregiver education  And LE ADL training throughout visit for lymphedema self  care/ home program, including compression wrapping, compression garment and device wear/care, lymphatic pumping ther ex, simple self-MLD, and skin care. Discussed progress towards goals.    Person(s) Educated  Patient    Methods  Explanation;Demonstration;Handout;Tactile cues;Verbal cues    Comprehension  Verbalized understanding;Returned demonstration;Verbal cues required;Tactile cues required;Need further instruction          OT Long Term Goals - 04/12/19 1300      OT LONG TERM GOAL #1   Title  Pt will apply BLE, knee length, multi-layer, short stretch compression wraps daily using correct gradient techniques with min caregiver assistance     to achieve optimal limb volume reduction, to return affected limb , as closely as possible, to premorbid size and shape, to limit infection risk, and to improve safe functional ambulation and mobility.    Baseline  dependent    Time  4    Period  Days    Status  Achieved    Target Date  04/12/19      OT LONG TERM GOAL #2   Title  Pt to achieve at least 10% BLE limb volume reductions below the knees during Intensive Phase CDT to increase  standing / walking tolerance , to increase functional performance of basic and instrumental ADLs, and to limit LE progression.  Baseline  dependent    Time  12    Period  Weeks    Status  Partially Met    Target Date  --   for RLE A-D with 21.47%  vol reduction     OT LONG TERM GOAL #3   Title  Pt will be able to verbalize signs and symptoms of cellulitis infection and identify 4 common lymphedema precautions using printed resource for reference (modified independence) to limit LE progression over time .    Baseline  Max A    Period  Days    Status  Partially Met    Target Date  --   tardiness limits progress on this goal     OT LONG TERM GOAL #4   Title  Pt will achieve and sustain at least 85%  compliance with daily LE self-care home program (skin care, lymphatic pumping therex, compression and simple  self-MLD) ) with min CG assistance during Intensive Phase CDT  to limit  limb swelling, reduce infection risk,  limit associated pain , and limit LE progression.    Baseline  Dependent    Time  12    Period  Weeks    Status  On-going    Target Date  --   Pt working towards this goal     OT LONG TERM GOAL #5   Title  Once issued Pt will be able to don and doff appropriate compression garments and/ or devices using correct techniques and assistive devices with extra time (modified independence) by end of Intensive Phase CDT for optimal LE management to limit progression over time.    Baseline  Max A    Time  12    Period  Weeks    Status  On-going    Target Date  --   unable to assess     OT LONG TERM GOAL #6   Title  During self-management phase of CDT Pt will retain limb volume reductions achieved during Intensive Phase CDT with no more than 3% volume increase to limit LE progression and further functional decline.    Baseline  Max A    Time  6    Period  Months    Status  On-going    Target Date  --   carry forward           Plan - 05/12/19 1633    Clinical Impression Statement  Provided MLD, skin care , compression wrapping, and Pt edu .Pt tolerted all aspects of OT without difficulty. Pt continues to await custom RLE compression garment delivery and fitting. Cont 2 x weekly CDT to RLE . Once garments are correctly firtted transition CDT to LLE.    OT Occupational Profile and History  Comprehensive Assessment- Review of records and extensive additional review of physical, cognitive, psychosocial history related to current functional performance    Occupational performance deficits (Please refer to evaluation for details):  ADL's;Leisure;IADL's;Social Participation;Work;Other   parenting role, self esteme, body image   Body Structure / Function / Physical Skills  ADL;Obesity;Decreased knowledge of precautions;Balance;Decreased knowledge of use of DME;Flexibility;IADL;Pain;Skin  integrity;Edema;Mobility;ROM;Gait    Rehab Potential  Good    Clinical Decision Making  Several treatment options, min-mod task modification necessary    Comorbidities Affecting Occupational Performance:  Presence of comorbidities impacting occupational performance    Comorbidities impacting occupational performance description:  See SUBJECTIVE    Modification or Assistance to Complete Evaluation   Min-Moderate modification of tasks or assist with assess necessary  to complete eval    OT Frequency  2x / week    OT Duration  12 weeks   and PRN   OT Treatment/Interventions  Self-care/ADL training;Manual lymph drainage;Coping strategies training;Patient/family education;Compression bandaging;Therapist, nutritional;Therapeutic activities;Manual Therapy;DME and/or AE instruction;Other (comment)   myofacial release, skin care   Plan  treat one leg at a time to limit fall risk. Fit with custom compression garments bilaterally needed to achieve correct containment and fit. Consider fitting with 32-chamber, sequential pneumatic compression device ([employer] Flexitouch "pump" ). The Flexitouch SPCD is the only sequential device that duplicates proximal to distal lymphatic decongestion to return interstitial fluid through inguinal and abdominal lymphatics to the heart. A basic pneumatic device is not appropriate for this patient because it does not treat deep abdominal lymphatics essential for transporting lymphatic congestion to the thoracic duct to return to circulation.       Patient will benefit from skilled therapeutic intervention in order to improve the following deficits and impairments:   Body Structure / Function / Physical Skills: ADL, Obesity, Decreased knowledge of precautions, Balance, Decreased knowledge of use of DME, Flexibility, IADL, Pain, Skin integrity, Edema, Mobility, ROM, Gait       Visit Diagnosis: Lymphedema, not elsewhere classified    Problem List Patient Active  Problem List   Diagnosis Date Noted  . 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 Anxiety state 11/28/2009  . Depressive disorder 11/28/2009  . Essential hypertension 11/28/2009    Andrey Spearman, MS, OTR/L, [facility] 05/12/19 4:35 PM  [place] MAIN [facility] SERVICES [address] [place], [place], [address] Phone: [phone]   Fax:  [phone]  Name: Jaclyn Kramer MRN: [id number] Date of Birth: October 05, 1955

## 2019-05-13 ENCOUNTER — Ambulatory Visit: Payer: Self-pay | Admitting: Occupational Therapy

## 2019-05-13 ENCOUNTER — Other Ambulatory Visit: Payer: Self-pay

## 2019-05-13 DIAGNOSIS — I890 Lymphedema, not elsewhere classified: Secondary | ICD-10-CM

## 2019-05-13 NOTE — Therapy (Signed)
[place] MAIN [facility] SERVICES [address] [place], [place], [address] Phone: [phone]   Fax:  [phone]  Occupational Therapy Treatment Note and Progress Report: Lymphedema Care  Patient Details  Name: Jaclyn Kramer MRN: [id number] Date of Birth: [birth year] Referring Provider (OT): Linton Ham, MD   Encounter Date: 05/13/2019  OT End of Session - 05/13/19 1621    Visit Number  20    Number of Visits  36    Date for OT Re-Evaluation  06/08/19    OT Start Time  0115    OT Stop Time  0210    OT Time Calculation (min)  55 min    Activity Tolerance  Patient tolerated treatment well;No increased pain    Behavior During Therapy  [facility] for tasks assessed/performed       No past medical history on file.  No past surgical history on file.  There were no vitals filed for this visit.  Subjective Assessment - 05/13/19 1317    Subjective   Jaclyn Kramer presents for OT visit 19/36 to address BLE lymphedema.  Pt presents with wraps in place. When asked if she had any pain, Pt replied, "Not that I know of."    Patient is accompanied by:  Family member   mother, Jaclyn Kramer   Pertinent History  Hx anemia, HTN, anxiety disorder, major depressive disorder, hx non-adherance to medical treatment;    Limitations  limits ambulation, standing tolerance, transfers, bed mobility, ascending and descending stairs, difficulty fitting LB clothing and shoes. difficulty reaching feet (AROM) difficulty bathing    Repetition  Increases Symptoms    Special Tests  + Stemmer dorsal feet Lymphedema Life Impact Scale (LLIS) TBA next visit    Pain Onset  More than a month ago          LYMPHEDEMA/ONCOLOGY QUESTIONNAIRE - 05/13/19 1618      Right Lower Extremity Lymphedema   Other  RLE A-D= 5057.65 ml; RLE E-G= 7963.0 ml; RLE A-G=13020.65 ml.    Other  RLE limb volume below the knww (a-d) is decreased aditionally by 10.53% since last measured on 04/12/19. RLE  A-D volume is decreased by 29.74% since commencing  OT for CDT on 03/17/19.              OT Treatments/Exercises (OP) - 05/13/19 0001      ADLs   ADL Education Given  Yes  (Pended)       Manual Therapy   Manual Therapy  Edema management;Manual Lymphatic Drainage (MLD);Compression Bandaging  (Pended)     Manual therapy comments  RLE comparative limb volumetrics  (Pended)     Edema Management  skin care  (Pended)     Manual Lymphatic Drainage (MLD)  MLD to RLE/ RLQ utilizing short neck  sequence, deep  abdominal pathways and functional inguinal LN.  (Pended)     Compression Bandaging  LLE multilayer gradient compression wrap from base of toes to tibial tuberosity using 8, 10 and 12" short stretch wraps over single layer of 0.4 cm Rosidal foam and cotton stockinett. Circumferential wrap only today. Added 8 x 5" Komprex dot foam pad to posterior leg w lower edge anchored ! 2 " above ankle crease   with strtch net. Added this under stockinet et al in efforrt to reduce dense fibrosis.  [employer])              OT Education - 05/13/19 1618    Education Details  Continued skilled Pt/caregiver  education  And LE ADL training throughout visit for lymphedema self care/ home program, including compression wrapping, compression garment and device wear/care, lymphatic pumping ther ex, simple self-MLD, and skin care. Discussed progress towards goals.    Person(s) Educated  Patient    Methods  Explanation;Demonstration;Handout;Tactile cues;Verbal cues    Comprehension  Verbalized understanding;Returned demonstration;Verbal cues required;Tactile cues required;Need further instruction          OT Long Term Goals - 05/13/19 1600      OT LONG TERM GOAL #1   Title  Pt will apply BLE, knee length, multi-layer, short stretch compression wraps daily using correct gradient techniques with min caregiver assistance     to achieve optimal limb volume reduction, to return affected limb , as closely as  possible, to premorbid size and shape, to limit infection risk, and to improve safe functional ambulation and mobility.    Baseline  dependent    Time  4    Period  Days    Status  Achieved      OT LONG TERM GOAL #2   Title  Pt to achieve at least 10% BLE limb volume reductions below the knees during Intensive Phase CDT to increase  standing / walking tolerance , to increase functional performance of basic and instrumental ADLs, and to limit LE progression.    Baseline  dependent    Time  12    Period  Weeks    Status  Partially Met   met and exceeded for RLE with 29.74% reduction since 107/20     OT LONG TERM GOAL #3   Title  Pt will be able to verbalize signs and symptoms of cellulitis infection and identify 4 common lymphedema precautions using printed resource for reference (modified independence) to limit LE progression over time .    Baseline  Max A    Time  4    Period  Days    Status  Achieved      OT LONG TERM GOAL #4   Title  Pt will achieve and sustain at least 85%  compliance with daily LE self-care home program (skin care, lymphatic pumping therex, compression and simple self-MLD) ) with min CG assistance during Intensive Phase CDT  to limit  limb swelling, reduce infection risk,  limit associated pain , and limit LE progression.    Baseline  Dependent    Time  12    Period  Weeks    Status  Achieved      OT LONG TERM GOAL #5   Title  Once issued Pt will be able to don and doff appropriate compression garments and/ or devices using correct techniques and assistive devices with extra time (modified independence) by end of Intensive Phase CDT for optimal LE management to limit progression over time.    Baseline  Max A    Time  12    Period  Weeks    Status  On-going      OT LONG TERM GOAL #6   Title  During self-management phase of CDT Pt will retain limb volume reductions achieved during Intensive Phase CDT with no more than 3% volume increase to limit LE progression  and further functional decline.    Baseline  Max A    Time  6    Period  Months    Status  On-going            Plan - 05/13/19 1624    Clinical Impression Statement  Pt  demonstrates excellent progress towards all OT goals to dat.  Comparative limb volumetrics performed on RLE today reveals Pt has met and significantly exceeded 10% limb volume reduction goal for the RLE with a 29.74% volume reduction from ankle to tibial tuberosity (A-D) sniice commencing Rx. Pt has been diligent with all LE self care and has achieved and sustained > 85% compliance for last few weeks. Pt tolerated MLD and compression wraps without difficulty today after progress assessment. Pt states she is pleased with RLE  volume reduction thus far. Cont as per POC.    OT Occupational Profile and History  Comprehensive Assessment- Review of records and extensive additional review of physical, cognitive, psychosocial history related to current functional performance    Occupational performance deficits (Please refer to evaluation for details):  ADL's;Leisure;IADL's;Social Participation;Work;Other   parenting role, self esteme, body image   Body Structure / Function / Physical Skills  ADL;Obesity;Decreased knowledge of precautions;Balance;Decreased knowledge of use of DME;Flexibility;IADL;Pain;Skin integrity;Edema;Mobility;ROM;Gait    Rehab Potential  Good    Clinical Decision Making  Several treatment options, min-mod task modification necessary    Comorbidities Affecting Occupational Performance:  Presence of comorbidities impacting occupational performance    Comorbidities impacting occupational performance description:  See SUBJECTIVE    Modification or Assistance to Complete Evaluation   Min-Moderate modification of tasks or assist with assess necessary to complete eval    OT Frequency  2x / week    OT Duration  12 weeks   and PRN   OT Treatment/Interventions  Self-care/ADL training;Manual lymph drainage;Coping  strategies training;Patient/family education;Compression bandaging;Therapist, nutritional;Therapeutic activities;Manual Therapy;DME and/or AE instruction;Other (comment)   myofacial release, skin care   Plan  treat one leg at a time to limit fall risk. Fit with custom compression garments bilaterally needed to achieve correct containment and fit. Consider fitting with 32-chamber, sequential pneumatic compression device ([employer] Flexitouch "pump" ). The Flexitouch SPCD is the only sequential device that duplicates proximal to distal lymphatic decongestion to return interstitial fluid through inguinal and abdominal lymphatics to the heart. A basic pneumatic device is not appropriate for this patient because it does not treat deep abdominal lymphatics essential for transporting lymphatic congestion to the thoracic duct to return to circulation.       Patient will benefit from skilled therapeutic intervention in order to improve the following deficits and impairments:   Body Structure / Function / Physical Skills: ADL, Obesity, Decreased knowledge of precautions, Balance, Decreased knowledge of use of DME, Flexibility, IADL, Pain, Skin integrity, Edema, Mobility, ROM, Gait       Visit Diagnosis: Lymphedema, not elsewhere classified    Problem List Patient Active Problem List   Diagnosis Date Noted  . 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 Anxiety state 11/28/2009  . Depressive disorder 11/28/2009  . Essential hypertension 11/28/2009    Andrey Spearman, MS, OTR/L, [facility] 05/13/19 4:29 PM  [place] MAIN [facility] SERVICES [address] [place], [place], [address] Phone: [phone]   Fax:  [phone]  Name: Jaclyn Kramer MRN: [id number] Date of Birth: [birth year]

## 2019-05-17 ENCOUNTER — Ambulatory Visit: Payer: Self-pay | Admitting: Occupational Therapy

## 2019-05-17 ENCOUNTER — Other Ambulatory Visit: Payer: Self-pay

## 2019-05-17 DIAGNOSIS — I890 Lymphedema, not elsewhere classified: Secondary | ICD-10-CM

## 2019-05-18 NOTE — Therapy (Signed)
[place] MAIN [facility] SERVICES [address] [place], [place], [address] Phone: [phone]   Fax:  [phone]  Occupational Therapy Treatment  Patient Details  Name: Jaclyn Kramer MRN: [id number] Date of Birth: 11-15-1955 Referring Provider (OT): Linton Ham, MD   Encounter Date: 05/17/2019  OT End of Session - 05/18/19 1356    Visit Number  21    Number of Visits  36    Date for OT Re-Evaluation  06/08/19    OT Start Time  0115    OT Stop Time  0210    OT Time Calculation (min)  55 min    Activity Tolerance  Patient tolerated treatment well;No increased pain    Behavior During Therapy  [facility] for tasks assessed/performed       No past medical history on file.  No past surgical history on file.  There were no vitals filed for this visit.  Subjective Assessment - 05/18/19 1354    Subjective   Jaclyn Kramer presents for OT visit 21/36 to address BLE lymphedema.  Pt presents  15 minutes late for a 60 minute OT session. Pt presents with RLE compression wraps in place, and she brings clean set of wraps as directed. Pt denies leg pain.    Patient is accompanied by:  Family member   mother, Jaclyn Kramer   Pertinent History  Hx anemia, HTN, anxiety disorder, major depressive disorder, hx non-adherance to medical treatment;    Limitations  limits ambulation, standing tolerance, transfers, bed mobility, ascending and descending stairs, difficulty fitting LB clothing and shoes. difficulty reaching feet (AROM) difficulty bathing    Repetition  Increases Symptoms    Special Tests  + Stemmer dorsal feet Lymphedema Life Impact Scale (LLIS) TBA next visit    Pain Onset  More than a month ago                   OT Treatments/Exercises (OP) - 05/18/19 0001      ADLs   ADL Education Given  Yes      Manual Therapy   Manual Therapy  Edema management;Manual Lymphatic Drainage (MLD);Compression Bandaging    Edema Management  skin care    Manual Lymphatic Drainage (MLD)  MLD to RLE/ RLQ utilizing short neck  sequence, deep  abdominal pathways and functional inguinal LN.    Compression Bandaging  RLE multilayer gradient compression wrap from base of toes to tibial tuberosity using 8, 10 and 12" short stretch wraps over single layer of 0.4 cm Rosidal foam and cotton stockinett. Circumferential wrap only today. Added 8 x 5" Komprex dot foam pad to posterior leg w lower edge anchored ! 2 " above ankle crease   with strtch net. Added this under stockinet et al in efforrt to reduce dense fibrosis.             OT Education - 05/18/19 1356    Education Details  Continued skilled Pt/caregiver education  And LE ADL training throughout visit for lymphedema self care/ home program, including compression wrapping, compression garment and device wear/care, lymphatic pumping ther ex, simple self-MLD, and skin care. Discussed progress towards goals.    Person(s) Educated  Patient    Methods  Explanation;Demonstration;Handout;Tactile cues;Verbal cues    Comprehension  Verbalized understanding;Returned demonstration;Verbal cues required;Tactile cues required;Need further instruction          OT Long Term Goals - 05/13/19 1600      OT LONG TERM GOAL #1   Title  Pt will apply BLE, knee length, multi-layer, short stretch compression wraps daily using correct gradient techniques with min caregiver assistance     to achieve optimal limb volume reduction, to return affected limb , as closely as possible, to premorbid size and shape, to limit infection risk, and to improve safe functional ambulation and mobility.    Baseline  dependent    Time  4    Period  Days    Status  Achieved      OT LONG TERM GOAL #2   Title  Pt to achieve at least 10% BLE limb volume reductions below the knees during Intensive Phase CDT to increase  standing / walking tolerance , to increase functional performance of basic and instrumental ADLs, and to limit LE  progression.    Baseline  dependent    Time  12    Period  Weeks    Status  Partially Met   met and exceeded for RLE with 29.74% reduction since 107/20     OT LONG TERM GOAL #3   Title  Pt will be able to verbalize signs and symptoms of cellulitis infection and identify 4 common lymphedema precautions using printed resource for reference (modified independence) to limit LE progression over time .    Baseline  Max A    Time  4    Period  Days    Status  Achieved      OT LONG TERM GOAL #4   Title  Pt will achieve and sustain at least 85%  compliance with daily LE self-care home program (skin care, lymphatic pumping therex, compression and simple self-MLD) ) with min CG assistance during Intensive Phase CDT  to limit  limb swelling, reduce infection risk,  limit associated pain , and limit LE progression.    Baseline  Dependent    Time  12    Period  Weeks    Status  Achieved      OT LONG TERM GOAL #5   Title  Once issued Pt will be able to don and doff appropriate compression garments and/ or devices using correct techniques and assistive devices with extra time (modified independence) by end of Intensive Phase CDT for optimal LE management to limit progression over time.    Baseline  Max A    Time  12    Period  Weeks    Status  On-going      OT LONG TERM GOAL #6   Title  During self-management phase of CDT Pt will retain limb volume reductions achieved during Intensive Phase CDT with no more than 3% volume increase to limit LE progression and further functional decline.    Baseline  Max A    Time  6    Period  Months    Status  On-going            Plan - 05/18/19 1357    Clinical Impression Statement  Pt tolerated MLD,. skin care and compression wraps to RLE today without difficulty. Custom compression garments are on order. Cont CDT to RLE until custom compression garment is fitted. Then transitions to LLE CDT.    OT Occupational Profile and History  Comprehensive  Assessment- Review of records and extensive additional review of physical, cognitive, psychosocial history related to current functional performance    Occupational performance deficits (Please refer to evaluation for details):  ADL's;Leisure;IADL's;Social Participation;Work;Other   parenting role, self esteme, body image   Body Structure / Function / Physical Skills  ADL;Obesity;Decreased knowledge of precautions;Balance;Decreased knowledge of use of DME;Flexibility;IADL;Pain;Skin integrity;Edema;Mobility;ROM;Gait    Rehab Potential  Good    Clinical Decision Making  Several treatment options, min-mod task modification necessary    Comorbidities Affecting Occupational Performance:  Presence of comorbidities impacting occupational performance    Comorbidities impacting occupational performance description:  See SUBJECTIVE    Modification or Assistance to Complete Evaluation   Min-Moderate modification of tasks or assist with assess necessary to complete eval    OT Frequency  2x / week    OT Duration  12 weeks   and PRN   OT Treatment/Interventions  Self-care/ADL training;Manual lymph drainage;Coping strategies training;Patient/family education;Compression bandaging;Therapist, nutritional;Therapeutic activities;Manual Therapy;DME and/or AE instruction;Other (comment)   myofacial release, skin care   Plan  treat one leg at a time to limit fall risk. Fit with custom compression garments bilaterally needed to achieve correct containment and fit. Consider fitting with 32-chamber, sequential pneumatic compression device (Tactile Medical Flexitouch "pump" ). The Flexitouch SPCD is the only sequential device that duplicates proximal to distal lymphatic decongestion to return interstitial fluid through inguinal and abdominal lymphatics to the heart. A basic pneumatic device is not appropriate for this patient because it does not treat deep abdominal lymphatics essential for transporting lymphatic  congestion to the thoracic duct to return to circulation.       Patient will benefit from skilled therapeutic intervention in order to improve the following deficits and impairments:   Body Structure / Function / Physical Skills: ADL, Obesity, Decreased knowledge of precautions, Balance, Decreased knowledge of use of DME, Flexibility, IADL, Pain, Skin integrity, Edema, Mobility, ROM, Gait       Visit Diagnosis: Lymphedema, not elsewhere classified    Problem List Patient Active Problem List   Diagnosis Date Noted  . 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 Anxiety state 11/28/2009  . Depressive disorder 11/28/2009  . Essential hypertension 11/28/2009    Andrey Spearman, MS, OTR/L, [facility] 05/18/19 1:59 PM  [place] MAIN [facility] SERVICES [address] [place], [place], [address] Phone: [phone]   Fax:  [phone]  Name: Jaclyn Kramer MRN: [id number] Date of Birth: 12-02-1955

## 2019-05-19 ENCOUNTER — Other Ambulatory Visit: Payer: Self-pay

## 2019-05-19 ENCOUNTER — Ambulatory Visit: Payer: Self-pay | Admitting: Occupational Therapy

## 2019-05-19 DIAGNOSIS — I890 Lymphedema, not elsewhere classified: Secondary | ICD-10-CM

## 2019-05-19 NOTE — Therapy (Signed)
[place] MAIN [facility] SERVICES [address] [place], [place], [address] Phone: [phone]   Fax:  [phone]  Occupational Therapy Treatment  Patient Details  Name: Jaclyn Kramer MRN: [id number] Date of Birth: 27-Sep-1955 Referring Provider (OT): Linton Ham, MD   Encounter Date: 05/19/2019  OT End of Session - 05/19/19 1403    Visit Number  22    Number of Visits  36    Date for OT Re-Evaluation  06/08/19    OT Start Time  0116    OT Stop Time  0205    OT Time Calculation (min)  49 min    Activity Tolerance  Patient tolerated treatment well;No increased pain    Behavior During Therapy  [facility] for tasks assessed/performed       No past medical history on file.  No past surgical history on file.  There were no vitals filed for this visit.  Subjective Assessment - 05/19/19 1257    Subjective   Jaclyn Kramer presents for OT visit 22/36 to address BLE lymphedema. Pt denies leg pain today but endorses back pain. "I pulled a muscle or something."    Patient is accompanied by:  Family member   mother, Abbeygail Igoe   Pertinent History  Hx anemia, HTN, anxiety disorder, major depressive disorder, hx non-adherance to medical treatment;    Limitations  limits ambulation, standing tolerance, transfers, bed mobility, ascending and descending stairs, difficulty fitting LB clothing and shoes. difficulty reaching feet (AROM) difficulty bathing    Repetition  Increases Symptoms    Special Tests  + Stemmer dorsal feet Lymphedema Life Impact Scale (LLIS) TBA next visit    Pain Onset  More than a month ago                   OT Treatments/Exercises (OP) - 05/19/19 0001      ADLs   ADL Education Given  Yes      Manual Therapy   Manual Therapy  Edema management    Edema Management  skin care    Manual Lymphatic Drainage (MLD)  MLD to RLE/ RLQ utilizing short neck  sequence, deep  abdominal pathways and functional inguinal LN.     Compression Bandaging  RLE multilayer gradient compression wrap from base of toes to tibial tuberosity using 8, 10 and 12" short stretch wraps over single layer of 0.4 cm Rosidal foam and cotton stockinett. Circumferential wrap only today. Added 8 x 5" Komprex dot foam pad to posterior leg w lower edge anchored ! 2 " above ankle crease   with strtch net. Added this under stockinet et al in efforrt to reduce dense fibrosis.             OT Education - 05/19/19 1257    Education Details  Continued skilled Pt/caregiver education  And LE ADL training throughout visit for lymphedema self care/ home program, including compression wrapping, compression garment and device wear/care, lymphatic pumping ther ex, simple self-MLD, and skin care. Discussed progress towards goals.    Person(s) Educated  Patient    Methods  Explanation;Demonstration;Handout;Tactile cues;Verbal cues    Comprehension  Verbalized understanding;Returned demonstration;Verbal cues required;Tactile cues required;Need further instruction          OT Long Term Goals - 05/13/19 1600      OT LONG TERM GOAL #1   Title  Pt will apply BLE, knee length, multi-layer, short stretch compression wraps daily using correct gradient techniques with min caregiver assistance  to achieve optimal limb volume reduction, to return affected limb , as closely as possible, to premorbid size and shape, to limit infection risk, and to improve safe functional ambulation and mobility.    Baseline  dependent    Time  4    Period  Days    Status  Achieved      OT LONG TERM GOAL #2   Title  Pt to achieve at least 10% BLE limb volume reductions below the knees during Intensive Phase CDT to increase  standing / walking tolerance , to increase functional performance of basic and instrumental ADLs, and to limit LE progression.    Baseline  dependent    Time  12    Period  Weeks    Status  Partially Met   met and exceeded for RLE with 29.74% reduction  since 107/20     OT LONG TERM GOAL #3   Title  Pt will be able to verbalize signs and symptoms of cellulitis infection and identify 4 common lymphedema precautions using printed resource for reference (modified independence) to limit LE progression over time .    Baseline  Max A    Time  4    Period  Days    Status  Achieved      OT LONG TERM GOAL #4   Title  Pt will achieve and sustain at least 85%  compliance with daily LE self-care home program (skin care, lymphatic pumping therex, compression and simple self-MLD) ) with min CG assistance during Intensive Phase CDT  to limit  limb swelling, reduce infection risk,  limit associated pain , and limit LE progression.    Baseline  Dependent    Time  12    Period  Weeks    Status  Achieved      OT LONG TERM GOAL #5   Title  Once issued Pt will be able to don and doff appropriate compression garments and/ or devices using correct techniques and assistive devices with extra time (modified independence) by end of Intensive Phase CDT for optimal LE management to limit progression over time.    Baseline  Max A    Time  12    Period  Weeks    Status  On-going      OT LONG TERM GOAL #6   Title  During self-management phase of CDT Pt will retain limb volume reductions achieved during Intensive Phase CDT with no more than 3% volume increase to limit LE progression and further functional decline.    Baseline  Max A    Time  6    Period  Months    Status  On-going            Plan - 05/19/19 1409    Clinical Impression Statement  Pt continues to demonstrate progress towards all OT goals for LE care. Pt tolerated MLD, Pt edu for le SELF CARE ANND COMPRESSION THERAPY IN CLINIC TODAY WITHOUT DIFFICULTY. pT AGREES W PLAN TO COMMENCE mld TO lle NEXT VIASIT WHIOLE AWAITING FITTING FOR rle CUSTOM KNEE HIGH. cONT AS PER poc.    OT Occupational Profile and History  Comprehensive Assessment- Review of records and extensive additional review of  physical, cognitive, psychosocial history related to current functional performance    Occupational performance deficits (Please refer to evaluation for details):  ADL's;Leisure;IADL's;Social Participation;Work;Other   parenting role, self esteme, body image   Body Structure / Function / Physical Skills  ADL;Obesity;Decreased knowledge of precautions;Balance;Decreased knowledge of  use of DME;Flexibility;IADL;Pain;Skin integrity;Edema;Mobility;ROM;Gait    Rehab Potential  Good    Clinical Decision Making  Several treatment options, min-mod task modification necessary    Comorbidities Affecting Occupational Performance:  Presence of comorbidities impacting occupational performance    Comorbidities impacting occupational performance description:  See SUBJECTIVE    Modification or Assistance to Complete Evaluation   Min-Moderate modification of tasks or assist with assess necessary to complete eval    OT Frequency  2x / week    OT Duration  12 weeks   and PRN   OT Treatment/Interventions  Self-care/ADL training;Manual lymph drainage;Coping strategies training;Patient/family education;Compression bandaging;Therapist, nutritional;Therapeutic activities;Manual Therapy;DME and/or AE instruction;Other (comment)   myofacial release, skin care   Plan  treat one leg at a time to limit fall risk. Fit with custom compression garments bilaterally needed to achieve correct containment and fit. Consider fitting with 32-chamber, sequential pneumatic compression device (Tactile Medical Flexitouch "pump" ). The Flexitouch SPCD is the only sequential device that duplicates proximal to distal lymphatic decongestion to return interstitial fluid through inguinal and abdominal lymphatics to the heart. A basic pneumatic device is not appropriate for this patient because it does not treat deep abdominal lymphatics essential for transporting lymphatic congestion to the thoracic duct to return to circulation.        Patient will benefit from skilled therapeutic intervention in order to improve the following deficits and impairments:   Body Structure / Function / Physical Skills: ADL, Obesity, Decreased knowledge of precautions, Balance, Decreased knowledge of use of DME, Flexibility, IADL, Pain, Skin integrity, Edema, Mobility, ROM, Gait       Visit Diagnosis: Lymphedema, not elsewhere classified    Problem List Patient Active Problem List   Diagnosis Date Noted  . 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 Anxiety state 11/28/2009  . Depressive disorder 11/28/2009  . Essential hypertension 11/28/2009    Andrey Spearman, MS, OTR/L, [facility] 05/19/19 2:11 PM   [place] MAIN [facility] SERVICES [address] [place], [place], [address] Phone: [phone]   Fax:  [phone]  Name: Karin Pinedo MRN: [id number] Date of Birth: 02/16/1956

## 2019-05-20 ENCOUNTER — Ambulatory Visit: Payer: Self-pay | Admitting: Occupational Therapy

## 2019-05-20 DIAGNOSIS — I890 Lymphedema, not elsewhere classified: Secondary | ICD-10-CM

## 2019-05-20 NOTE — Therapy (Signed)
[place] MAIN [facility] SERVICES [address] [place], [place], [address] Phone: [phone]   Fax:  [phone]  Occupational Therapy Treatment  Patient Details  Name: Jaclyn Kramer MRN: [id number] Date of Birth: April 05, 1956 Referring Provider (OT): Linton Ham, MD   Encounter Date: 05/20/2019  OT End of Session - 05/20/19 1405    Visit Number  24    Number of Visits  36    Date for OT Re-Evaluation  06/08/19    OT Start Time  0111    OT Stop Time  0210    OT Time Calculation (min)  59 min    Activity Tolerance  Patient tolerated treatment well;No increased pain    Behavior During Therapy  [facility] for tasks assessed/performed       No past medical history on file.  No past surgical history on file.  There were no vitals filed for this visit.  Subjective Assessment - 05/20/19 1314    Subjective   Jaclyn Kramer presents for OT visit 24/36 to address BLE lymphedema. Pt  denies leg pain today. She rates back pain at 4/10. Pt had moist heat pack throughout session.                   OT Treatments/Exercises (OP) - 05/20/19 0001      ADLs   ADL Education Given  Yes      Manual Therapy   Manual Therapy  Edema management    Edema Management  skin care    Manual Lymphatic Drainage (MLD)  MLD to RLE/ RLQ utilizing short neck  sequence, deep  abdominal pathways and functional inguinal LN.    Compression Bandaging  RLE multilayer gradient compression wrap from base of toes to tibial tuberosity using 8, 10 and 12" short stretch wraps over single layer of 0.4 cm Rosidal foam and cotton stockinett. Circumferential wrap only today. Added 8 x 5" Komprex dot foam pad to posterior leg w lower edge anchored ! 2 " above ankle crease   with strtch net. Added this under stockinet et al in efforrt to reduce dense fibrosis.             OT Education - 05/20/19 1405    Education Details  Continued skilled Pt/caregiver education  And LE ADL  training throughout visit for lymphedema self care/ home program, including compression wrapping, compression garment and device wear/care, lymphatic pumping ther ex, simple self-MLD, and skin care. Discussed progress towards goals.    Person(s) Educated  Patient    Methods  Explanation;Demonstration;Handout;Tactile cues;Verbal cues    Comprehension  Verbalized understanding;Returned demonstration;Verbal cues required;Tactile cues required;Need further instruction          OT Long Term Goals - 05/13/19 1600      OT LONG TERM GOAL #1   Title  Pt will apply BLE, knee length, multi-layer, short stretch compression wraps daily using correct gradient techniques with min caregiver assistance     to achieve optimal limb volume reduction, to return affected limb , as closely as possible, to premorbid size and shape, to limit infection risk, and to improve safe functional ambulation and mobility.    Baseline  dependent    Time  4    Period  Days    Status  Achieved      OT LONG TERM GOAL #2   Title  Pt to achieve at least 10% BLE limb volume reductions below the knees during Intensive Phase CDT to increase  standing / walking tolerance , to increase functional performance of basic and instrumental ADLs, and to limit LE progression.    Baseline  dependent    Time  12    Period  Weeks    Status  Partially Met   met and exceeded for RLE with 29.74% reduction since 107/20     OT LONG TERM GOAL #3   Title  Pt will be able to verbalize signs and symptoms of cellulitis infection and identify 4 common lymphedema precautions using printed resource for reference (modified independence) to limit LE progression over time .    Baseline  Max A    Time  4    Period  Days    Status  Achieved      OT LONG TERM GOAL #4   Title  Pt will achieve and sustain at least 85%  compliance with daily LE self-care home program (skin care, lymphatic pumping therex, compression and simple self-MLD) ) with min CG  assistance during Intensive Phase CDT  to limit  limb swelling, reduce infection risk,  limit associated pain , and limit LE progression.    Baseline  Dependent    Time  12    Period  Weeks    Status  Achieved      OT LONG TERM GOAL #5   Title  Once issued Pt will be able to don and doff appropriate compression garments and/ or devices using correct techniques and assistive devices with extra time (modified independence) by end of Intensive Phase CDT for optimal LE management to limit progression over time.    Baseline  Max A    Time  12    Period  Weeks    Status  On-going      OT LONG TERM GOAL #6   Title  During self-management phase of CDT Pt will retain limb volume reductions achieved during Intensive Phase CDT with no more than 3% volume increase to limit LE progression and further functional decline.    Baseline  Max A    Time  6    Period  Months    Status  On-going            Plan - 05/20/19 1506    Clinical Impression Statement  Pt tolerated MLD,. skin care and compression wraps to RLE today without difficulty. Custom compression garments are on order. Cont CDT to RLE until custom compression garment is fitted. Then transitions to LLE CDT.    OT Occupational Profile and History  Comprehensive Assessment- Review of records and extensive additional review of physical, cognitive, psychosocial history related to current functional performance    Occupational performance deficits (Please refer to evaluation for details):  ADL's;Leisure;IADL's;Social Participation;Work;Other   parenting role, self esteme, body image   Body Structure / Function / Physical Skills  ADL;Obesity;Decreased knowledge of precautions;Balance;Decreased knowledge of use of DME;Flexibility;IADL;Pain;Skin integrity;Edema;Mobility;ROM;Gait    Rehab Potential  Good    Clinical Decision Making  Several treatment options, min-mod task modification necessary    Comorbidities Affecting Occupational Performance:   Presence of comorbidities impacting occupational performance    Comorbidities impacting occupational performance description:  See SUBJECTIVE    Modification or Assistance to Complete Evaluation   Min-Moderate modification of tasks or assist with assess necessary to complete eval    OT Frequency  2x / week    OT Duration  12 weeks   and PRN   OT Treatment/Interventions  Self-care/ADL training;Manual lymph drainage;Coping strategies training;Patient/family education;Compression bandaging;Functional Mobility  Training;Therapeutic activities;Manual Therapy;DME and/or AE instruction;Other (comment)   myofacial release, skin care   Plan  treat one leg at a time to limit fall risk. Fit with custom compression garments bilaterally needed to achieve correct containment and fit. Consider fitting with 32-chamber, sequential pneumatic compression device (Tactile Medical Flexitouch "pump" ). The Flexitouch SPCD is the only sequential device that duplicates proximal to distal lymphatic decongestion to return interstitial fluid through inguinal and abdominal lymphatics to the heart. A basic pneumatic device is not appropriate for this patient because it does not treat deep abdominal lymphatics essential for transporting lymphatic congestion to the thoracic duct to return to circulation.       Patient will benefit from skilled therapeutic intervention in order to improve the following deficits and impairments:   Body Structure / Function / Physical Skills: ADL, Obesity, Decreased knowledge of precautions, Balance, Decreased knowledge of use of DME, Flexibility, IADL, Pain, Skin integrity, Edema, Mobility, ROM, Gait       Visit Diagnosis: Lymphedema, not elsewhere classified    Problem List Patient Active Problem List   Diagnosis Date Noted  . 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 Anxiety state 11/28/2009  . Depressive disorder 11/28/2009  . Essential hypertension 11/28/2009    Andrey Spearman, MS, OTR/L,  [facility] 05/20/19 3:08 PM   [place] MAIN [facility] SERVICES [address] [place], [place], [address] Phone: [phone]   Fax:  [phone]  Name: Jaclyn Kramer MRN: [id number] Date of Birth: 29-Aug-1955

## 2019-05-24 ENCOUNTER — Ambulatory Visit: Payer: Self-pay | Admitting: Occupational Therapy

## 2019-05-24 ENCOUNTER — Other Ambulatory Visit: Payer: Self-pay

## 2019-05-24 DIAGNOSIS — I890 Lymphedema, not elsewhere classified: Secondary | ICD-10-CM

## 2019-05-25 NOTE — Therapy (Signed)
[place] MAIN [facility] SERVICES [address] [place], [place], [place], [address] Phone: [phone]   Fax:  [phone]  Occupational Therapy Treatment  Patient Details  Name: Jaclyn Kramer MRN: [id number] Date of Birth: March 24, 1956 Referring Provider (OT): Linton Ham, MD   Encounter Date: 05/24/2019  OT End of Session - 05/24/19 1054    Visit Number  25    Number of Visits  36    Date for OT Re-Evaluation  06/08/19    OT Start Time  0115    OT Stop Time  0212    OT Time Calculation (min)  57 min    Activity Tolerance  Patient tolerated treatment well;No increased pain    Behavior During Therapy  [facility] for tasks assessed/performed       No past medical history on file.  No past surgical history on file.  There were no vitals filed for this visit.  Subjective Assessment - 05/24/19 1052    Subjective   Jaclyn Kramer presents for OT visit 25/36 to address BLE lymphedema. Pt  denies leg pain today. Back pain is unchanged. Pt brings clean wraps to clinic. Pt declines moist heat pack to back                   OT Treatments/Exercises (OP) - 05/25/19 0001      ADLs   ADL Education Given  Yes      Manual Therapy   Manual Therapy  Edema management;Manual Lymphatic Drainage (MLD);Compression Bandaging    Edema Management  skin care    Manual Lymphatic Drainage (MLD)  MLD to LLE/ LLQ utilizing short neck  sequence, deep  abdominal pathways and functional inguinal LN.    Compression Bandaging  RLE multilayer gradient compression wrap from base of toes to tibial tuberosity using 8, 10 and 12" short stretch wraps over single layer of 0.4 cm Rosidal foam and cotton stockinett. Circumferential wrap only today. Added 8 x 5" Komprex dot foam pad to posterior leg w lower edge anchored ! 2 " above ankle crease   with strtch net. Added this under stockinet et al in efforrt to reduce dense fibrosis.             OT Education - 05/24/19 1054    Education Details  Continued skilled Pt/caregiver education  And LE ADL training throughout visit for lymphedema self care/ home program, including compression wrapping, compression garment and device wear/care, lymphatic pumping ther ex, simple self-MLD, and skin care. Discussed progress towards goals.    Person(s) Educated  Patient    Methods  Explanation;Demonstration;Handout;Tactile cues;Verbal cues    Comprehension  Verbalized understanding;Returned demonstration;Verbal cues required;Tactile cues required;Need further instruction          OT Long Term Goals - 05/13/19 1600      OT LONG TERM GOAL #1   Title  Pt will apply BLE, knee length, multi-layer, short stretch compression wraps daily using correct gradient techniques with min caregiver assistance     to achieve optimal limb volume reduction, to return affected limb , as closely as possible, to premorbid size and shape, to limit infection risk, and to improve safe functional ambulation and mobility.    Baseline  dependent    Time  4    Period  Days    Status  Achieved      OT LONG TERM GOAL #2   Title  Pt to achieve at least 10% BLE limb volume reductions below the knees  during Intensive Phase CDT to increase  standing / walking tolerance , to increase functional performance of basic and instrumental ADLs, and to limit LE progression.    Baseline  dependent    Time  12    Period  Weeks    Status  Partially Met   met and exceeded for RLE with 29.74% reduction since 107/20     OT LONG TERM GOAL #3   Title  Pt will be able to verbalize signs and symptoms of cellulitis infection and identify 4 common lymphedema precautions using printed resource for reference (modified independence) to limit LE progression over time .    Baseline  Max A    Time  4    Period  Days    Status  Achieved      OT LONG TERM GOAL #4   Title  Pt will achieve and sustain at least 85%  compliance with daily LE self-care home program (skin care, lymphatic  pumping therex, compression and simple self-MLD) ) with min CG assistance during Intensive Phase CDT  to limit  limb swelling, reduce infection risk,  limit associated pain , and limit LE progression.    Baseline  Dependent    Time  12    Period  Weeks    Status  Achieved      OT LONG TERM GOAL #5   Title  Once issued Pt will be able to don and doff appropriate compression garments and/ or devices using correct techniques and assistive devices with extra time (modified independence) by end of Intensive Phase CDT for optimal LE management to limit progression over time.    Baseline  Max A    Time  12    Period  Weeks    Status  On-going      OT LONG TERM GOAL #6   Title  During self-management phase of CDT Pt will retain limb volume reductions achieved during Intensive Phase CDT with no more than 3% volume increase to limit LE progression and further functional decline.    Baseline  Max A    Time  6    Period  Months    Status  On-going            Plan - 05/24/19 1055    Clinical Impression Statement  Pt tolerated MLD,. skin care and compression wraps to RLE today without difficulty. Custom compression garments are on order. Cont CDT to RLE until custom compression garment is fitted. Then transitions to LLE CDT.    OT Occupational Profile and History  Comprehensive Assessment- Review of records and extensive additional review of physical, cognitive, psychosocial history related to current functional performance    Occupational performance deficits (Please refer to evaluation for details):  ADL's;Leisure;IADL's;Social Participation;Work;Other   parenting role, self esteme, body image   Body Structure / Function / Physical Skills  ADL;Obesity;Decreased knowledge of precautions;Balance;Decreased knowledge of use of DME;Flexibility;IADL;Pain;Skin integrity;Edema;Mobility;ROM;Gait    Rehab Potential  Good    Clinical Decision Making  Several treatment options, min-mod task modification  necessary    Comorbidities Affecting Occupational Performance:  Presence of comorbidities impacting occupational performance    Comorbidities impacting occupational performance description:  See SUBJECTIVE    Modification or Assistance to Complete Evaluation   Min-Moderate modification of tasks or assist with assess necessary to complete eval    OT Frequency  2x / week    OT Duration  12 weeks   and PRN   OT Treatment/Interventions  Self-care/ADL training;Manual  lymph drainage;Coping strategies training;Patient/family education;Compression bandaging;Therapist, nutritional;Therapeutic activities;Manual Therapy;DME and/or AE instruction;Other (comment)   myofacial release, skin care   Plan  treat one leg at a time to limit fall risk. Fit with custom compression garments bilaterally needed to achieve correct containment and fit. Consider fitting with 32-chamber, sequential pneumatic compression device (Tactile Medical Flexitouch "pump" ). The Flexitouch SPCD is the only sequential device that duplicates proximal to distal lymphatic decongestion to return interstitial fluid through inguinal and abdominal lymphatics to the heart. A basic pneumatic device is not appropriate for this patient because it does not treat deep abdominal lymphatics essential for transporting lymphatic congestion to the thoracic duct to return to circulation.       Patient will benefit from skilled therapeutic intervention in order to improve the following deficits and impairments:   Body Structure / Function / Physical Skills: ADL, Obesity, Decreased knowledge of precautions, Balance, Decreased knowledge of use of DME, Flexibility, IADL, Pain, Skin integrity, Edema, Mobility, ROM, Gait       Visit Diagnosis: Lymphedema, not elsewhere classified    Problem List Patient Active Problem List   Diagnosis Date Noted  . 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 Anxiety state 11/28/2009  . Depressive disorder 11/28/2009  .  Essential hypertension 11/28/2009   Andrey Spearman, MS, OTR/L, [facility] 05/25/19 10:56 AM   [place] MAIN [facility] SERVICES [address] [place], [place], [address] Phone: [phone]   Fax:  [phone]  Name: Jaclyn Kramer MRN: [id number] Date of Birth: [birth year]

## 2019-05-26 ENCOUNTER — Other Ambulatory Visit: Payer: Self-pay

## 2019-05-26 ENCOUNTER — Encounter: Payer: Self-pay | Admitting: Occupational Therapy

## 2019-05-26 ENCOUNTER — Ambulatory Visit: Payer: Self-pay | Admitting: Occupational Therapy

## 2019-05-26 DIAGNOSIS — I890 Lymphedema, not elsewhere classified: Secondary | ICD-10-CM

## 2019-05-26 NOTE — Therapy (Signed)
[place] MAIN [facility] SERVICES [address] [place], [place], [address] Phone: [phone]   Fax:  [phone]  Occupational Therapy Treatment  Patient Details  Name: Jaclyn Kramer MRN: [id number] Date of Birth: 10/29/1955 Referring Provider (OT): Linton Ham, MD   Encounter Date: 05/26/2019  OT End of Session - 05/26/19 1523    Visit Number  26    Number of Visits  36    Date for OT Re-Evaluation  06/08/19    OT Start Time  0115    OT Stop Time  0210    OT Time Calculation (min)  55 min    Activity Tolerance  Patient tolerated treatment well;No increased pain    Behavior During Therapy  [facility] for tasks assessed/performed       History reviewed. No pertinent past medical history.  History reviewed. No pertinent surgical history.  There were no vitals filed for this visit.  Subjective Assessment - 05/26/19 1316    Subjective   Jaclyn Kramer presents for OT visit 25/36 to address BLE lymphedema. Pt  denies leg pain again today. Back pain is resolved. Pt brings clean wraps to clinic.                   OT Treatments/Exercises (OP) - 05/26/19 0001      ADLs   ADL Education Given  Yes      Manual Therapy   Manual Therapy  Edema management;Manual Lymphatic Drainage (MLD);Compression Bandaging    Edema Management  skin care    Manual Lymphatic Drainage (MLD)  MLD to LLE/ LLQ utilizing short neck  sequence, deep  abdominal pathways and functional inguinal LN.    Compression Bandaging  RLE multilayer gradient compression wrap from base of toes to tibial tuberosity using 8, 10 and 12" short stretch wraps over single layer of 0.4 cm Rosidal foam and cotton stockinett. Circumferential wrap only today. Added 8 x 5" Komprex dot foam pad to posterior leg w lower edge anchored ! 2 " above ankle crease   with strtch net. Added this under stockinet et al in efforrt to reduce dense fibrosis.             OT Education - 05/26/19 1320     Education Details  Continued skilled Pt/caregiver education  And LE ADL training throughout visit for lymphedema self care/ home program, including compression wrapping, compression garment and device wear/care, lymphatic pumping ther ex, simple self-MLD, and skin care. Discussed progress towards goals.    Person(s) Educated  Patient    Methods  Explanation;Demonstration;Handout;Tactile cues;Verbal cues    Comprehension  Verbalized understanding;Returned demonstration;Verbal cues required;Tactile cues required;Need further instruction          OT Long Term Goals - 05/13/19 1600      OT LONG TERM GOAL #1   Title  Pt will apply BLE, knee length, multi-layer, short stretch compression wraps daily using correct gradient techniques with min caregiver assistance     to achieve optimal limb volume reduction, to return affected limb , as closely as possible, to premorbid size and shape, to limit infection risk, and to improve safe functional ambulation and mobility.    Baseline  dependent    Time  4    Period  Days    Status  Achieved      OT LONG TERM GOAL #2   Title  Pt to achieve at least 10% BLE limb volume reductions below the knees during Intensive Phase CDT  to increase  standing / walking tolerance , to increase functional performance of basic and instrumental ADLs, and to limit LE progression.    Baseline  dependent    Time  12    Period  Weeks    Status  Partially Met   met and exceeded for RLE with 29.74% reduction since 107/20     OT LONG TERM GOAL #3   Title  Pt will be able to verbalize signs and symptoms of cellulitis infection and identify 4 common lymphedema precautions using printed resource for reference (modified independence) to limit LE progression over time .    Baseline  Max A    Time  4    Period  Days    Status  Achieved      OT LONG TERM GOAL #4   Title  Pt will achieve and sustain at least 85%  compliance with daily LE self-care home program (skin care,  lymphatic pumping therex, compression and simple self-MLD) ) with min CG assistance during Intensive Phase CDT  to limit  limb swelling, reduce infection risk,  limit associated pain , and limit LE progression.    Baseline  Dependent    Time  12    Period  Weeks    Status  Achieved      OT LONG TERM GOAL #5   Title  Once issued Pt will be able to don and doff appropriate compression garments and/ or devices using correct techniques and assistive devices with extra time (modified independence) by end of Intensive Phase CDT for optimal LE management to limit progression over time.    Baseline  Max A    Time  12    Period  Weeks    Status  On-going      OT LONG TERM GOAL #6   Title  During self-management phase of CDT Pt will retain limb volume reductions achieved during Intensive Phase CDT with no more than 3% volume increase to limit LE progression and further functional decline.    Baseline  Max A    Time  6    Period  Months    Status  On-going            Plan - 05/26/19 1524    Clinical Impression Statement  Jaclyn Kramer continues to demonstrate steady progress towards all OT goals. LLE skin condition continues to soften and become more pliable. Fibrosis is reducing at posterior distal leg with convoluted foam padding under wraps. Pt demonstrates much improved compliance with compression wrapping daily between visits. Limb volume of RLE appears to have reached clinical plateau with excellent volumetric reduction. Custom garment is on order. We commenced MLD to LLE last week and continued today. Pt tolerates all aspects of OT for LE manage,emt. Cont as per POC.    OT Occupational Profile and History  Comprehensive Assessment- Review of records and extensive additional review of physical, cognitive, psychosocial history related to current functional performance    Occupational performance deficits (Please refer to evaluation for details):  ADL's;Leisure;IADL's;Social  Participation;Work;Other   parenting role, self esteme, body image   Body Structure / Function / Physical Skills  ADL;Obesity;Decreased knowledge of precautions;Balance;Decreased knowledge of use of DME;Flexibility;IADL;Pain;Skin integrity;Edema;Mobility;ROM;Gait    Rehab Potential  Good    Clinical Decision Making  Several treatment options, min-mod task modification necessary    Comorbidities Affecting Occupational Performance:  Presence of comorbidities impacting occupational performance    Comorbidities impacting occupational performance description:  See SUBJECTIVE  Modification or Assistance to Complete Evaluation   Min-Moderate modification of tasks or assist with assess necessary to complete eval    OT Frequency  2x / week    OT Duration  12 weeks   and PRN   OT Treatment/Interventions  Self-care/ADL training;Manual lymph drainage;Coping strategies training;Patient/family education;Compression bandaging;Therapist, nutritional;Therapeutic activities;Manual Therapy;DME and/or AE instruction;Other (comment)   myofacial release, skin care   Plan  treat one leg at a time to limit fall risk. Fit with custom compression garments bilaterally needed to achieve correct containment and fit. Consider fitting with 32-chamber, sequential pneumatic compression device (Tactile Medical Flexitouch "pump" ). The Flexitouch SPCD is the only sequential device that duplicates proximal to distal lymphatic decongestion to return interstitial fluid through inguinal and abdominal lymphatics to the heart. A basic pneumatic device is not appropriate for this patient because it does not treat deep abdominal lymphatics essential for transporting lymphatic congestion to the thoracic duct to return to circulation.       Patient will benefit from skilled therapeutic intervention in order to improve the following deficits and impairments:   Body Structure / Function / Physical Skills: ADL, Obesity, Decreased  knowledge of precautions, Balance, Decreased knowledge of use of DME, Flexibility, IADL, Pain, Skin integrity, Edema, Mobility, ROM, Gait       Visit Diagnosis: Lymphedema, not elsewhere classified    Problem List Patient Active Problem List   Diagnosis Date Noted  . 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 Anxiety state 11/28/2009  . Depressive disorder 11/28/2009  . Essential hypertension 11/28/2009    Andrey Spearman, MS, OTR/L, [facility] 05/26/19 3:27 PM  [place] MAIN [facility] SERVICES [address] [place], [place], [address] Phone: [phone]   Fax:  [phone]  Name: Jaclyn Kramer MRN: [id number] Date of Birth: Sep 01, 1955

## 2019-05-27 ENCOUNTER — Other Ambulatory Visit: Payer: Self-pay

## 2019-05-27 ENCOUNTER — Ambulatory Visit: Payer: Self-pay | Admitting: Occupational Therapy

## 2019-05-27 DIAGNOSIS — I890 Lymphedema, not elsewhere classified: Secondary | ICD-10-CM

## 2019-05-27 NOTE — Therapy (Signed)
[place] MAIN [facility] SERVICES [address] [place], [place], [address] Phone: [phone]   Fax:  [phone]  Occupational Therapy Treatment  Patient Details  Name: Jaclyn Kramer MRN: [id number] Date of Birth: July 14, 1955 Referring Provider (OT): Linton Ham, MD   Encounter Date: 05/27/2019  OT End of Session - 05/27/19 6256    Visit Number  27    Number of Visits  36    Date for OT Re-Evaluation  06/08/19    OT Start Time  0113    OT Stop Time  0210    OT Time Calculation (min)  57 min       No past medical history on file.  No past surgical history on file.  There were no vitals filed for this visit.  Subjective Assessment - 05/27/19 1401    Subjective   Jaclyn Kramer presents for OT visit 26/36 to address BLE lymphedema. Pt has no new complaints today.                   OT Treatments/Exercises (OP) - 05/27/19 0001      ADLs   ADL Education Given  Yes      Manual Therapy   Manual Therapy  Edema management    Edema Management  skin care    Manual Lymphatic Drainage (MLD)  MLD to RLE/ LLQ utilizing short neck  sequence, deep  abdominal pathways and functional inguinal LN.    Compression Bandaging  RLE multilayer gradient compression wrap from base of toes to tibial tuberosity using 8, 10 and 12" short stretch wraps over single layer of 0.4 cm Rosidal foam and cotton stockinett. Circumferential wrap only today. Added 8 x 5" Komprex dot foam pad to posterior leg w lower edge anchored ! 2 " above ankle crease   with strtch net. Added this under stockinet et al in efforrt to reduce dense fibrosis.                  OT Long Term Goals - 05/13/19 1600      OT LONG TERM GOAL #1   Title  Pt will apply BLE, knee length, multi-layer, short stretch compression wraps daily using correct gradient techniques with min caregiver assistance     to achieve optimal limb volume reduction, to return affected limb , as  closely as possible, to premorbid size and shape, to limit infection risk, and to improve safe functional ambulation and mobility.    Baseline  dependent    Time  4    Period  Days    Status  Achieved      OT LONG TERM GOAL #2   Title  Pt to achieve at least 10% BLE limb volume reductions below the knees during Intensive Phase CDT to increase  standing / walking tolerance , to increase functional performance of basic and instrumental ADLs, and to limit LE progression.    Baseline  dependent    Time  12    Period  Weeks    Status  Partially Met   met and exceeded for RLE with 29.74% reduction since 107/20     OT LONG TERM GOAL #3   Title  Pt will be able to verbalize signs and symptoms of cellulitis infection and identify 4 common lymphedema precautions using printed resource for reference (modified independence) to limit LE progression over time .    Baseline  Max A    Time  4    Period  Days    Status  Achieved      OT LONG TERM GOAL #4   Title  Pt will achieve and sustain at least 85%  compliance with daily LE self-care home program (skin care, lymphatic pumping therex, compression and simple self-MLD) ) with min CG assistance during Intensive Phase CDT  to limit  limb swelling, reduce infection risk,  limit associated pain , and limit LE progression.    Baseline  Dependent    Time  12    Period  Weeks    Status  Achieved      OT LONG TERM GOAL #5   Title  Once issued Pt will be able to don and doff appropriate compression garments and/ or devices using correct techniques and assistive devices with extra time (modified independence) by end of Intensive Phase CDT for optimal LE management to limit progression over time.    Baseline  Max A    Time  12    Period  Weeks    Status  On-going      OT LONG TERM GOAL #6   Title  During self-management phase of CDT Pt will retain limb volume reductions achieved during Intensive Phase CDT with no more than 3% volume increase to limit LE  progression and further functional decline.    Baseline  Max A    Time  6    Period  Months    Status  On-going            Plan - 05/27/19 1404    Clinical Impression Statement  Pt tolerated MLD,. skin care and compression wraps to RLE today without difficulty. Custom compression garments are on order. Cont CDT to RLE until custom compression garment is fitted. Then transitions to LLE CDT.    OT Occupational Profile and History  Comprehensive Assessment- Review of records and extensive additional review of physical, cognitive, psychosocial history related to current functional performance    Occupational performance deficits (Please refer to evaluation for details):  ADL's;Leisure;IADL's;Social Participation;Work;Other   parenting role, self esteme, body image   Body Structure / Function / Physical Skills  ADL;Obesity;Decreased knowledge of precautions;Balance;Decreased knowledge of use of DME;Flexibility;IADL;Pain;Skin integrity;Edema;Mobility;ROM;Gait    Rehab Potential  Good    Clinical Decision Making  Several treatment options, min-mod task modification necessary    Comorbidities Affecting Occupational Performance:  Presence of comorbidities impacting occupational performance    Comorbidities impacting occupational performance description:  See SUBJECTIVE    Modification or Assistance to Complete Evaluation   Min-Moderate modification of tasks or assist with assess necessary to complete eval    OT Frequency  2x / week    OT Duration  12 weeks   and PRN   OT Treatment/Interventions  Self-care/ADL training;Manual lymph drainage;Coping strategies training;Patient/family education;Compression bandaging;Therapist, nutritional;Therapeutic activities;Manual Therapy;DME and/or AE instruction;Other (comment)   myofacial release, skin care   Plan  treat one leg at a time to limit fall risk. Fit with custom compression garments bilaterally needed to achieve correct containment and fit.  Consider fitting with 32-chamber, sequential pneumatic compression device (Tactile Medical Flexitouch "pump" ). The Flexitouch SPCD is the only sequential device that duplicates proximal to distal lymphatic decongestion to return interstitial fluid through inguinal and abdominal lymphatics to the heart. A basic pneumatic device is not appropriate for this patient because it does not treat deep abdominal lymphatics essential for transporting lymphatic congestion to the thoracic duct to return to circulation.       Patient will  benefit from skilled therapeutic intervention in order to improve the following deficits and impairments:   Body Structure / Function / Physical Skills: ADL, Obesity, Decreased knowledge of precautions, Balance, Decreased knowledge of use of DME, Flexibility, IADL, Pain, Skin integrity, Edema, Mobility, ROM, Gait       Visit Diagnosis: Lymphedema, not elsewhere classified    Problem List Patient Active Problem List   Diagnosis Date Noted  . 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 Anxiety state 11/28/2009  . Depressive disorder 11/28/2009  . Essential hypertension 11/28/2009    Andrey Spearman, MS, OTR/L, [facility] 05/27/19 2:06 PM  [place] MAIN [facility] SERVICES [address] [place], [place], [address] Phone: [phone]   Fax:  [phone]  Name: Jaclyn Kramer MRN: [id number] Date of Birth: 26-Oct-1955

## 2019-05-31 ENCOUNTER — Ambulatory Visit: Payer: Self-pay | Admitting: Occupational Therapy

## 2019-06-01 NOTE — Therapy (Signed)
[place] [facility] MAIN REHAB SERVICES [address] [place], [place], [address] Phone: [phone]   Fax:  [phone]  Occupational Therapy Treatment  Patient Details  Name: Jaclyn Kramer MRN: [id number] Date of Birth: 02/13/1956 Referring Provider (OT): Michael Robson, MD   Encounter Date: 05/31/2019  OT End of Session - 06/01/19 1653    Visit Number  27    Number of Visits  36    Date for OT Re-Evaluation  06/08/19    OT Start Time  0113    OT Stop Time  0210    OT Time Calculation (min)  57 min       No past medical history on file.  No past surgical history on file.  There were no vitals filed for this visit.  Subjective Assessment - 05/31/19 1058    Subjective   Jaclyn Kramer presents for OT visit 27/36 to address BLE lymphedema. Pt has no new complaints today. Pt presents with gradient compression in place.                                OT Long Term Goals - 05/13/19 1600      OT LONG TERM GOAL #1   Title  Pt will apply BLE, knee length, multi-layer, short stretch compression wraps daily using correct gradient techniques with min caregiver assistance     to achieve optimal limb volume reduction, to return affected limb , as closely as possible, to premorbid size and shape, to limit infection risk, and to improve safe functional ambulation and mobility.    Baseline  dependent    Time  4    Period  Days    Status  Achieved      OT LONG TERM GOAL #2   Title  Pt to achieve at least 10% BLE limb volume reductions below the knees during Intensive Phase CDT to increase  standing / walking tolerance , to increase functional performance of basic and instrumental ADLs, and to limit LE progression.    Baseline  dependent    Time  12    Period  Weeks    Status  Partially Met   met and exceeded for RLE with 29.74% reduction since 107/20     OT LONG TERM GOAL #3   Title  Pt will be able to verbalize signs and symptoms  of cellulitis infection and identify 4 common lymphedema precautions using printed resource for reference (modified independence) to limit LE progression over time .    Baseline  Max A    Time  4    Period  Days    Status  Achieved      OT LONG TERM GOAL #4   Title  Pt will achieve and sustain at least 85%  compliance with daily LE self-care home program (skin care, lymphatic pumping therex, compression and simple self-MLD) ) with min CG assistance during Intensive Phase CDT  to limit  limb swelling, reduce infection risk,  limit associated pain , and limit LE progression.    Baseline  Dependent    Time  12    Period  Weeks    Status  Achieved      OT LONG TERM GOAL #5   Title  Once issued Pt will be able to don and doff appropriate compression garments and/ or devices using correct techniques and assistive devices with extra time (modified independence) by end of   Intensive Phase CDT for optimal LE management to limit progression over time.    Baseline  Max A    Time  12    Period  Weeks    Status  On-going      OT LONG TERM GOAL #6   Title  During self-management phase of CDT Pt will retain limb volume reductions achieved during Intensive Phase CDT with no more than 3% volume increase to limit LE progression and further functional decline.    Baseline  Max A    Time  6    Period  Months    Status  On-going              Patient will benefit from skilled therapeutic intervention in order to improve the following deficits and impairments:           Visit Diagnosis: Lymphedema, not elsewhere classified    Problem List Patient Active Problem List   Diagnosis Date Noted  . 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 Anxiety state 11/28/2009  . Depressive disorder 11/28/2009  . Essential hypertension 11/28/2009   Theresa Gilliam, MS, OTR/L, [facility] 06/01/19 4:56 PM  [facility] [facility] MAIN REHAB SERVICES [address] [place], [place],  [address] Phone: [phone]   Fax:  [phone]  Name: Jaclyn Kramer MRN: [id number] Date of Birth: 11/01/1955 

## 2019-06-02 ENCOUNTER — Ambulatory Visit: Payer: Self-pay | Admitting: Occupational Therapy

## 2019-06-02 ENCOUNTER — Other Ambulatory Visit: Payer: Self-pay

## 2019-06-02 DIAGNOSIS — I890 Lymphedema, not elsewhere classified: Secondary | ICD-10-CM

## 2019-06-02 NOTE — Therapy (Signed)
[place] MAIN [facility] SERVICES [address] [place], [place], [address] Phone: [phone]   Fax:  [phone]  Occupational Therapy Treatment  Patient Details  Name: Jaclyn Kramer MRN: [id number] Date of Birth: 09-10-1955 Referring Provider (OT): Linton Ham, MD   Encounter Date: 06/02/2019  OT End of Session - 06/02/19 1338    Visit Number  28    Number of Visits  36    Date for OT Re-Evaluation  06/08/19    OT Start Time  0120    OT Stop Time  0210    OT Time Calculation (min)  50 min    Activity Tolerance  Patient tolerated treatment well;No increased pain    Behavior During Therapy  [facility] for tasks assessed/performed       No past medical history on file.  No past surgical history on file.  There were no vitals filed for this visit.  Subjective Assessment - 06/02/19 1333    Subjective   Jaclyn Kramer presents for OT visit 28/36 to address BLE lymphedema. Manufacturer's rep for [employer], Jaclyn Kramer, is here today to assist Jaclyn Kramer w/ trial of Flexitouch [phone] advanced sequential pneumatic compression device (SPCD) .                   OT Treatments/Exercises (OP) - 06/02/19 0001      ADLs   ADL Education Given  Yes      Manual Therapy   Manual Therapy  Edema management    Edema Management  trial with advanced sequential pneumatic compression pump   medically necessary to facilitate LE self care at home over time    Compression Bandaging  wraps not changed  in clinic today due to time constraints. Pt is independent with wraps and will rewrap at home after visit.             OT Education - 06/02/19 1336    Education Details  Pt edu throughout pump trial for sequential pump indications, contraindications, medical justification, care and use regimes.    Person(s) Educated  Patient    Methods  Explanation;Demonstration;Handout;Tactile cues;Verbal cues    Comprehension  Verbalized  understanding;Returned demonstration;Verbal cues required;Tactile cues required;Need further instruction          OT Long Term Goals - 05/13/19 1600      OT LONG TERM GOAL #1   Title  Pt will apply BLE, knee length, multi-layer, short stretch compression wraps daily using correct gradient techniques with min caregiver assistance     to achieve optimal limb volume reduction, to return affected limb , as closely as possible, to premorbid size and shape, to limit infection risk, and to improve safe functional ambulation and mobility.    Baseline  dependent    Time  4    Period  Days    Status  Achieved      OT LONG TERM GOAL #2   Title  Pt to achieve at least 10% BLE limb volume reductions below the knees during Intensive Phase CDT to increase  standing / walking tolerance , to increase functional performance of basic and instrumental ADLs, and to limit LE progression.    Baseline  dependent    Time  12    Period  Weeks    Status  Partially Met   met and exceeded for RLE with 29.74% reduction since 107/20     OT LONG TERM GOAL #3   Title  Pt will be  able to verbalize signs and symptoms of cellulitis infection and identify 4 common lymphedema precautions using printed resource for reference (modified independence) to limit LE progression over time .    Baseline  Max A    Time  4    Period  Days    Status  Achieved      OT LONG TERM GOAL #4   Title  Pt will achieve and sustain at least 85%  compliance with daily LE self-care home program (skin care, lymphatic pumping therex, compression and simple self-MLD) ) with min CG assistance during Intensive Phase CDT  to limit  limb swelling, reduce infection risk,  limit associated pain , and limit LE progression.    Baseline  Dependent    Time  12    Period  Weeks    Status  Achieved      OT LONG TERM GOAL #5   Title  Once issued Pt will be able to don and doff appropriate compression garments and/ or devices using correct techniques and  assistive devices with extra time (modified independence) by end of Intensive Phase CDT for optimal LE management to limit progression over time.    Baseline  Max A    Time  12    Period  Weeks    Status  On-going      OT LONG TERM GOAL #6   Title  During self-management phase of CDT Pt will retain limb volume reductions achieved during Intensive Phase CDT with no more than 3% volume increase to limit LE progression and further functional decline.    Baseline  Max A    Time  6    Period  Months    Status  On-going            Plan - 06/02/19 1344    Clinical Impression Statement  Jaclyn Kramer completed trial of Flexitouch advanced sequential pneumatic compression device (SPCD in the clinic today under OT supervision. This 32-chamber device is the only available sequential device which facilitates proximal to distal lymphatic fluid return through the deep abdominal lymphatics to the thoracic duct and on to the heart. The basic pneumatic pump is not appropriate for this patient because it does not treat deep abdominal lymphatics essential for returning lymphatic fluid to the heart for recirculation.. Jaclyn Kramer   tolerated full 1 hr. Fexitouch trial to LLE using 20-30 mmHg compression without increased pain or shortness of breath. Positive indentations below knee observed at end of the treatment session when device was removed indicates fluid and tissue protein were malleable by the device.  Jaclyn Kramer presents with moderate stage II, I89.0 BLE lymphedema. The E0652 Flexitouch pneumatic compression device is medically necessary to treat BLE lymphedema, including activation of abdominal region lymphatics, at home on a daily basis for long term self-management of lower extremity and truncal lymph edema. Pt agrees to re-apply LLE compression wraps at home today. Cont as per POC.    OT Occupational Profile and History  Comprehensive Assessment- Review of records and extensive additional review of  physical, cognitive, psychosocial history related to current functional performance    Occupational performance deficits (Please refer to evaluation for details):  ADL's;Leisure;IADL's;Social Participation;Work;Other   parenting role, self esteme, body image   Body Structure / Function / Physical Skills  ADL;Obesity;Decreased knowledge of precautions;Balance;Decreased knowledge of use of DME;Flexibility;IADL;Pain;Skin integrity;Edema;Mobility;ROM;Gait    Rehab Potential  Good    Clinical Decision Making  Several treatment options, min-mod task modification necessary  Comorbidities Affecting Occupational Performance:  Presence of comorbidities impacting occupational performance    Comorbidities impacting occupational performance description:  See SUBJECTIVE    Modification or Assistance to Complete Evaluation   Min-Moderate modification of tasks or assist with assess necessary to complete eval    OT Frequency  2x / week    OT Duration  12 weeks   and PRN   OT Treatment/Interventions  Self-care/ADL training;Manual lymph drainage;Coping strategies training;Patient/family education;Compression bandaging;Therapist, nutritional;Therapeutic activities;Manual Therapy;DME and/or AE instruction;Other (comment)   myofacial release, skin care   Plan  treat one leg at a time to limit fall risk. Fit with custom compression garments bilaterally needed to achieve correct containment and fit. Consider fitting with 32-chamber, sequential pneumatic compression device ([employer] Flexitouch "pump" ). The Flexitouch SPCD is the only sequential device that duplicates proximal to distal lymphatic decongestion to return interstitial fluid through inguinal and abdominal lymphatics to the heart. A basic pneumatic device is not appropriate for this patient because it does not treat deep abdominal lymphatics essential for transporting lymphatic congestion to the thoracic duct to return to circulation.        Patient will benefit from skilled therapeutic intervention in order to improve the following deficits and impairments:   Body Structure / Function / Physical Skills: ADL, Obesity, Decreased knowledge of precautions, Balance, Decreased knowledge of use of DME, Flexibility, IADL, Pain, Skin integrity, Edema, Mobility, ROM, Gait       Visit Diagnosis: Lymphedema, not elsewhere classified    Problem List Patient Active Problem List   Diagnosis Date Noted  . 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 Anxiety state 11/28/2009  . Depressive disorder 11/28/2009  . Essential hypertension 11/28/2009    Andrey Spearman, MS, OTR/L, [facility] 06/02/19 3:25 PM  [place] MAIN [facility] SERVICES [address] [place], [place], [address] Phone: [phone]   Fax:  [phone]  Name: Gracyn Allor MRN: [id number] Date of Birth: 1956/01/05

## 2019-06-15 ENCOUNTER — Ambulatory Visit: Payer: Self-pay | Attending: Internal Medicine | Admitting: Occupational Therapy

## 2019-06-15 ENCOUNTER — Other Ambulatory Visit: Payer: Self-pay

## 2019-06-15 DIAGNOSIS — I890 Lymphedema, not elsewhere classified: Secondary | ICD-10-CM | POA: Insufficient documentation

## 2019-06-15 NOTE — Therapy (Signed)
[place] MAIN [facility] SERVICES [address] [place], [place], [address] Phone: [phone]   Fax:  [phone]  Occupational Therapy Treatment  Patient Details  Name: Jaclyn Kramer MRN: [id number] Date of Birth: 10-09-1955 Referring Provider (OT): Linton Ham, MD   Encounter Date: 06/15/2019  OT End of Session - 06/15/19 1441    Visit Number  29    Number of Visits  36    Date for OT Re-Evaluation  06/08/19    Authorization Type  2021: 1    OT Start Time  0118    OT Stop Time  0230    OT Time Calculation (min)  72 min    Activity Tolerance  Patient tolerated treatment well;No increased pain    Behavior During Therapy  [facility] for tasks assessed/performed       No past medical history on file.  No past surgical history on file.  There were no vitals filed for this visit.  Subjective Assessment - 06/15/19 1438    Subjective   [name] presents for OT visit 29/36 to address BLE lymphedema. Pt was last seen for CDT to LLE on 06/02/19 / She reports she has been 100% compliant with LE self care home program daily during visit interval. Pt reports she has not yet received remake for RLE custom  compression knee high.Pt denies pain in her RLE today and has no new complaints.                   OT Treatments/Exercises (OP) - 06/15/19 0001      ADLs   ADL Education Given  Yes      Manual Therapy   Manual Therapy  Edema management;Manual Lymphatic Drainage (MLD);Compression Bandaging    Manual Lymphatic Drainage (MLD)  MLD to RLE/ LLQ utilizing short neck  sequence, deep  abdominal pathways and functional inguinal LN.    Compression Bandaging  compression wraps to RLE as established.              OT Education - 06/15/19 1441    Education Details  Continued skilled Pt/caregiver education  And LE ADL training throughout visit for lymphedema self care/ home program, including compression wrapping, compression garment and  device wear/care, lymphatic pumping ther ex, simple self-MLD, and skin care. Discussed progress towards goals.    Person(s) Educated  Patient    Methods  Explanation;Demonstration;Handout;Tactile cues;Verbal cues    Comprehension  Verbalized understanding;Returned demonstration;Verbal cues required;Tactile cues required;Need further instruction          OT Long Term Goals - 06/15/19 1400      OT LONG TERM GOAL #1   Title  Pt will apply BLE, knee length, multi-layer, short stretch compression wraps daily using correct gradient techniques with min caregiver assistance     to achieve optimal limb volume reduction, to return affected limb , as closely as possible, to premorbid size and shape, to limit infection risk, and to improve safe functional ambulation and mobility.    Baseline  dependent    Time  4    Period  Days    Status  Achieved      OT LONG TERM GOAL #2   Title  Pt to achieve at least 10% BLE limb volume reductions below the knees during Intensive Phase CDT to increase  standing / walking tolerance , to increase functional performance of basic and instrumental ADLs, and to limit LE progression.    Baseline  dependent  Time  12    Period  Weeks    Status  Partially Met   met and exceeded for RLE with 29.74% reduction since 107/20     OT LONG TERM GOAL #3   Title  Pt will be able to verbalize signs and symptoms of cellulitis infection and identify 4 common lymphedema precautions using printed resource for reference (modified independence) to limit LE progression over time .    Baseline  Max A    Time  4    Period  Days    Status  Achieved      OT LONG TERM GOAL #4   Title  Pt will achieve and sustain at least 85%  compliance with daily LE self-care home program (skin care, lymphatic pumping therex, compression and simple self-MLD) ) with min CG assistance during Intensive Phase CDT  to limit  limb swelling, reduce infection risk,  limit associated pain , and limit LE  progression.    Baseline  Dependent    Time  12    Period  Weeks    Status  Achieved      OT LONG TERM GOAL #5   Title  Once issued Pt will be able to don and doff appropriate compression garments and/ or devices using correct techniques and assistive devices with extra time (modified independence) by end of Intensive Phase CDT for optimal LE management to limit progression over time.    Baseline  Max A    Time  12    Period  Weeks    Status  On-going      OT LONG TERM GOAL #6   Title  During self-management phase of CDT Pt will retain limb volume reductions achieved during Intensive Phase CDT with no more than 3% volume increase to limit LE progression and further functional decline.    Baseline  Max A    Time  6    Period  Months    Status  On-going            Plan - 06/15/19 1442    Clinical Impression Statement  Pt did an excellent job we LE self care during 13 day visit interval over the Christmas holidays. Skin is in excellent condition  in terms of hydration.  Dense fibrosis at posterior leg and ankle is mildly softer than when we commenced CDT, but stubbornly persists. It is doubtful that this long standing fibrosis formation will resolve to premorbid condition as it is too advanced at this time. Swelling is well managed. Pt qualified for donated Flexitouch sequrntial pump to be ddelivered the end of this week. Manufacturer's rep will assist with set up and training.Pt tolerated RLE MLD, skin care and gradient compression wrapping in clinic today without difficulty. We are awaiting delivery of remade cvustom RLE garment. Once fitting is complete shift Rx to LLE. Cont as per POC.    OT Occupational Profile and History  Comprehensive Assessment- Review of records and extensive additional review of physical, cognitive, psychosocial history related to current functional performance    Occupational performance deficits (Please refer to evaluation for details):   ADL's;Leisure;IADL's;Social Participation;Work;Other   parenting role, self esteme, body image   Body Structure / Function / Physical Skills  ADL;Obesity;Decreased knowledge of precautions;Balance;Decreased knowledge of use of DME;Flexibility;IADL;Pain;Skin integrity;Edema;Mobility;ROM;Gait    Rehab Potential  Good    Clinical Decision Making  Several treatment options, min-mod task modification necessary    Comorbidities Affecting Occupational Performance:  Presence of comorbidities impacting occupational  performance    Comorbidities impacting occupational performance description:  See SUBJECTIVE    Modification or Assistance to Complete Evaluation   Min-Moderate modification of tasks or assist with assess necessary to complete eval    OT Frequency  2x / week    OT Duration  12 weeks   and PRN   OT Treatment/Interventions  Self-care/ADL training;Manual lymph drainage;Coping strategies training;Patient/family education;Compression bandaging;Therapist, nutritional;Therapeutic activities;Manual Therapy;DME and/or AE instruction;Other (comment)   myofacial release, skin care   Plan  treat one leg at a time to limit fall risk. Fit with custom compression garments bilaterally needed to achieve correct containment and fit. Consider fitting with 32-chamber, sequential pneumatic compression device ([employer] Flexitouch "pump" ). The Flexitouch SPCD is the only sequential device that duplicates proximal to distal lymphatic decongestion to return interstitial fluid through inguinal and abdominal lymphatics to the heart. A basic pneumatic device is not appropriate for this patient because it does not treat deep abdominal lymphatics essential for transporting lymphatic congestion to the thoracic duct to return to circulation.       Patient will benefit from skilled therapeutic intervention in order to improve the following deficits and impairments:   Body Structure / Function / Physical Skills:  ADL, Obesity, Decreased knowledge of precautions, Balance, Decreased knowledge of use of DME, Flexibility, IADL, Pain, Skin integrity, Edema, Mobility, ROM, Gait       Visit Diagnosis: Lymphedema, not elsewhere classified    Problem List Patient Active Problem List   Diagnosis Date Noted  . 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 Anxiety state 11/28/2009  . Depressive disorder 11/28/2009  . Essential hypertension 11/28/2009   Andrey Spearman, MS, OTR/L, [facility] 06/15/19 2:50 PM  [place] MAIN [facility] SERVICES [address] [place], [place], [address] Phone: [phone]   Fax:  [phone]  Name: Dayana Dalporto MRN: [id number] Date of Birth: Apr 14, 1956

## 2019-06-17 ENCOUNTER — Ambulatory Visit: Payer: Self-pay | Admitting: Occupational Therapy

## 2019-06-17 ENCOUNTER — Other Ambulatory Visit: Payer: Self-pay

## 2019-06-17 DIAGNOSIS — I890 Lymphedema, not elsewhere classified: Secondary | ICD-10-CM

## 2019-06-17 NOTE — Therapy (Signed)
[place] MAIN [facility] SERVICES [address] [place], [place], [address] Phone: [phone]   Fax:  [phone]  Occupational Therapy Progress Report and Treatment Note Lymphedema Care  Patient Details  Name: Jaclyn Kramer MRN: [id number] Date of Birth: 22-Sep-1955 Referring Provider (OT): Linton Ham, MD   Encounter Date: 06/17/2019  OT End of Session - 06/17/19 1224    Visit Number  30    Number of Visits  36    Date for OT Re-Evaluation  09/15/19    Authorization Type  2021: 2nd visit    OT Start Time  1025    OT Stop Time  1110    OT Time Calculation (min)  45 min    Activity Tolerance  Patient tolerated treatment well;No increased pain    Behavior During Therapy  [facility] for tasks assessed/performed       No past medical history on file.  No past surgical history on file.  There were no vitals filed for this visit.  Subjective Assessment - 06/17/19 1219    Subjective   Jaclyn Kramer presents for OT visit 23/36 to address BLE lymphedema. Pt prwesents with compression wraps in place. She denies leg 0pain today.    Currently in Pain?  No/denies                           OT Education - 06/17/19 1223    Education Details  Continued skilled Pt/caregiver education  And LE ADL training throughout visit for lymphedema self care/ home program, including compression wrapping, compression garment and device wear/care, lymphatic pumping ther ex, simple self-MLD, and skin care. Discussed progress towards goals.    Person(s) Educated  Patient    Methods  Explanation;Demonstration;Handout;Tactile cues;Verbal cues    Comprehension  Verbalized understanding;Returned demonstration;Verbal cues required;Tactile cues required;Need further instruction          OT Long Term Goals - 06/17/19 1200      OT LONG TERM GOAL #1   Title  Pt will apply BLE, knee length, multi-layer, short stretch compression wraps daily using correct  gradient techniques with min caregiver assistance     to achieve optimal limb volume reduction, to return affected limb , as closely as possible, to premorbid size and shape, to limit infection risk, and to improve safe functional ambulation and mobility.    Baseline  dependent    Time  4    Period  Days    Status  Achieved      OT LONG TERM GOAL #2   Title  Pt to achieve at least 10% BLE limb volume reductions below the knees during Intensive Phase CDT to increase  standing / walking tolerance , to increase functional performance of basic and instrumental ADLs, and to limit LE progression.    Baseline  dependent    Time  12    Period  Weeks    Status  Partially Met   06/17/19:  RLE additional reduction of 12.16% since 11/2.      OT LONG TERM GOAL #3   Title  Pt will be able to verbalize signs and symptoms of cellulitis infection and identify 4 common lymphedema precautions using printed resource for reference (modified independence) to limit LE progression over time .    Baseline  Max A    Time  4    Period  Days    Status  Achieved      OT LONG  TERM GOAL #4   Title  Pt will achieve and sustain at least 85%  compliance with daily LE self-care home program (skin care, lymphatic pumping therex, compression and simple self-MLD) ) with min CG assistance during Intensive Phase CDT  to limit  limb swelling, reduce infection risk,  limit associated pain , and limit LE progression.    Baseline  Dependent    Time  12    Period  Weeks    Status  Achieved      OT LONG TERM GOAL #5   Title  Once issued Pt will be able to don and doff appropriate compression garments and/ or devices using correct techniques and assistive devices with extra time (modified independence) by end of Intensive Phase CDT for optimal LE management to limit progression over time.    Baseline  Max A    Time  12    Period  Weeks    Status  On-going      OT LONG TERM GOAL #6   Title  During self-management phase of CDT Pt  will retain limb volume reductions achieved during Intensive Phase CDT with no more than 3% volume increase to limit LE progression and further functional decline.    Baseline  Max A    Time  6    Period  Months    Status  On-going            Plan - 06/17/19 1225    Clinical Impression Statement  PROGRESS NOTE: Comparative limb volumetrics of RLE (current Rx limb) reveals additional limb volume reduction below the R knee of 12.16% since last measured on 04/12/19. Overall RLE volume reduction below the knee measures 31.03%, which meets and exceeds reduction goal. Pt remains 100% compliant with all home program components including simple self MLD, skin care, ther ex and compression wraps. Skin condition is improved with hydration and skin flexibility by 100%. Cellulitis risk for RLE is significantly decreased. Ankle AROM is WNL. Pt declining RLE pain at present. No MLD today due to tardiness, but applied compression after completing volumetrics. Cont as per POC. [place] funded compression garments are on order for RLE. Manufacturer's rep for 32 chamber , sequential pneumatic Flexitouch device is approved and due to ship. Pt will benefit from ongoing OT for lymphedema management to reduce limb    volume, decrease infection risk, improve functional ambuulation and transfers, to reduce pain, and to increase functional performance in all occupational domains.  Commence LLE CDT as soon as RLE garment fitting is comlete.    OT Occupational Profile and History  Comprehensive Assessment- Review of records and extensive additional review of physical, cognitive, psychosocial history related to current functional performance    Occupational performance deficits (Please refer to evaluation for details):  ADL's;Leisure;IADL's;Social Participation;Work;Other   parenting role, self esteme, body image   Body Structure / Function / Physical Skills  ADL;Obesity;Decreased knowledge of precautions;Balance;Decreased  knowledge of use of DME;Flexibility;IADL;Pain;Skin integrity;Edema;Mobility;ROM;Gait    Rehab Potential  Good    Clinical Decision Making  Several treatment options, min-mod task modification necessary    Comorbidities Affecting Occupational Performance:  Presence of comorbidities impacting occupational performance    Comorbidities impacting occupational performance description:  See SUBJECTIVE    Modification or Assistance to Complete Evaluation   Min-Moderate modification of tasks or assist with assess necessary to complete eval    OT Frequency  2x / week    OT Duration  12 weeks   and PRN   OT  Treatment/Interventions  Self-care/ADL training;Manual lymph drainage;Coping strategies training;Patient/family education;Compression bandaging;Therapist, nutritional;Therapeutic activities;Manual Therapy;DME and/or AE instruction;Other (comment)   myofacial release, skin care   Plan  treat one leg at a time to limit fall risk. Fit with custom compression garments bilaterally needed to achieve correct containment and fit. Consider fitting with 32-chamber, sequential pneumatic compression device ([employer] Flexitouch "pump" ). The Flexitouch SPCD is the only sequential device that duplicates proximal to distal lymphatic decongestion to return interstitial fluid through inguinal and abdominal lymphatics to the heart. A basic pneumatic device is not appropriate for this patient because it does not treat deep abdominal lymphatics essential for transporting lymphatic congestion to the thoracic duct to return to circulation.       Patient will benefit from skilled therapeutic intervention in order to improve the following deficits and impairments:   Body Structure / Function / Physical Skills: ADL, Obesity, Decreased knowledge of precautions, Balance, Decreased knowledge of use of DME, Flexibility, IADL, Pain, Skin integrity, Edema, Mobility, ROM, Gait       Visit Diagnosis: Lymphedema, not  elsewhere classified    Problem List Patient Active Problem List   Diagnosis Date Noted  . 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 Anxiety state 11/28/2009  . Depressive disorder 11/28/2009  . Essential hypertension 11/28/2009    Andrey Spearman, MS, OTR/L, [facility] 06/17/19 12:37 PM  [place] MAIN [facility] SERVICES [address] [place], [place], [address] Phone: [phone]   Fax:  [phone]  Name: Jaclyn Kramer MRN: [id number] Date of Birth: May 26, 1956

## 2019-06-22 ENCOUNTER — Other Ambulatory Visit: Payer: Self-pay

## 2019-06-22 ENCOUNTER — Ambulatory Visit: Payer: Self-pay | Admitting: Occupational Therapy

## 2019-06-22 DIAGNOSIS — I890 Lymphedema, not elsewhere classified: Secondary | ICD-10-CM

## 2019-06-22 NOTE — Therapy (Signed)
[place] MAIN [facility] SERVICES [address] [place], [place], [address] Phone: [phone]   Fax:  [phone]  Occupational Therapy Treatment  Patient Details  Name: Jaclyn Kramer MRN: [id number] Date of Birth: 06-30-1955 Referring Provider (OT): Linton Ham, MD   Encounter Date: 06/22/2019  OT End of Session - 06/22/19 1200    Visit Number  31    Number of Visits  36    Date for OT Re-Evaluation  09/15/19    Authorization Type  2021: 2nd visit    OT Start Time  1015    OT Stop Time  1115    OT Time Calculation (min)  60 min    Activity Tolerance  Patient tolerated treatment well;No increased pain    Behavior During Therapy  Jaclyn Kramer for tasks assessed/performed       No past medical history on file.  No past surgical history on file.  There were no vitals filed for this visit.  Subjective Assessment - 06/22/19 1022    Subjective   Jaclyn Kramer presents for OT visit 24/36 to address BLE lymphedema. Pt brings RLE custom   compression knee high to clinic for fitting. Pt brings donated Flexitouch device to clinic for fitting. Jaclyn Kramer from [employer], the manufacturer's rep, is coming to her appointment scheduled on 1/26 for device fitting. Pt denies leg pain today.                   OT Treatments/Exercises (OP) - 06/22/19 0001      ADLs   ADL Education Given  Yes      Manual Therapy   Manual Therapy  Edema management    Edema Management  custom, ccl 3 Elvarex A-D fitting.     Manual Lymphatic Drainage (MLD)  MLD to RLE/ LLQ utilizing short neck  sequence, deep  abdominal pathways and functional inguinal LN.    Compression Bandaging  compression wraps to RLE as established.              OT Education - 06/22/19 1159    Education Details  Pt edu for compression garment fitting and remake process. Pt edu for Flexitouch device fitting.    Person(s) Educated  Patient    Methods   Explanation;Demonstration;Tactile cues;Verbal cues    Comprehension  Verbalized understanding;Returned demonstration;Verbal cues required;Tactile cues required;Need further instruction          OT Long Term Goals - 06/17/19 1200      OT LONG TERM GOAL #1   Title  Pt will apply BLE, knee length, multi-layer, short stretch compression wraps daily using correct gradient techniques with min caregiver assistance     to achieve optimal limb volume reduction, to return affected limb , as closely as possible, to premorbid size and shape, to limit infection risk, and to improve safe functional ambulation and mobility.    Baseline  dependent    Time  4    Period  Days    Status  Achieved      OT LONG TERM GOAL #2   Title  Pt to achieve at least 10% BLE limb volume reductions below the knees during Intensive Phase CDT to increase  standing / walking tolerance , to increase functional performance of basic and instrumental ADLs, and to limit LE progression.    Baseline  dependent    Time  12    Period  Weeks    Status  Partially Met   06/17/19:  RLE additional reduction of 12.16% since 11/2.      OT LONG TERM GOAL #3   Title  Pt will be able to verbalize signs and symptoms of cellulitis infection and identify 4 common lymphedema precautions using printed resource for reference (modified independence) to limit LE progression over time .    Baseline  Max A    Time  4    Period  Days    Status  Achieved      OT LONG TERM GOAL #4   Title  Pt will achieve and sustain at least 85%  compliance with daily LE self-care home program (skin care, lymphatic pumping therex, compression and simple self-MLD) ) with min CG assistance during Intensive Phase CDT  to limit  limb swelling, reduce infection risk,  limit associated pain , and limit LE progression.    Baseline  Dependent    Time  12    Period  Weeks    Status  Achieved      OT LONG TERM GOAL #5   Title  Once issued Pt will be able to don and doff  appropriate compression garments and/ or devices using correct techniques and assistive devices with extra time (modified independence) by end of Intensive Phase CDT for optimal LE management to limit progression over time.    Baseline  Max A    Time  12    Period  Weeks    Status  On-going      OT LONG TERM GOAL #6   Title  During self-management phase of CDT Pt will retain limb volume reductions achieved during Intensive Phase CDT with no more than 3% volume increase to limit LE progression and further functional decline.    Baseline  Max A    Time  6    Period  Months    Status  On-going            Plan - 06/22/19 1201    Clinical Impression Statement  Custom RLE knee length Elvarex compression garemnt ( flat knit ccl 3: 25-35 mmHg) originally measured for on 11/23 is too short . Completed new measurements and faxed to DME vendor with request for a remake. Remainder of visit devoted to MLD , skin care and compression wraps to RLE. Pt will see [employer] rep for training and fitting for Jan 26th in clinic.Cont as per POC.    OT Occupational Profile and History  Comprehensive Assessment- Review of records and extensive additional review of physical, cognitive, psychosocial history related to current functional performance    Occupational performance deficits (Please refer to evaluation for details):  ADL's;Leisure;IADL's;Social Participation;Work;Other   parenting role, self esteme, body image   Body Structure / Function / Physical Skills  ADL;Obesity;Decreased knowledge of precautions;Balance;Decreased knowledge of use of DME;Flexibility;IADL;Pain;Skin integrity;Edema;Mobility;ROM;Gait    Rehab Potential  Good    Clinical Decision Making  Several treatment options, min-mod task modification necessary    Comorbidities Affecting Occupational Performance:  Presence of comorbidities impacting occupational performance    Comorbidities impacting occupational performance description:   See SUBJECTIVE    Modification or Assistance to Complete Evaluation   Min-Moderate modification of tasks or assist with assess necessary to complete eval    OT Frequency  2x / week    OT Duration  12 weeks   and PRN   OT Treatment/Interventions  Self-care/ADL training;Manual lymph drainage;Coping strategies training;Patient/family education;Compression bandaging;Therapist, nutritional;Therapeutic activities;Manual Therapy;DME and/or AE instruction;Other (comment)   myofacial release, skin care  Plan  treat one leg at a time to limit fall risk. Fit with custom compression garments bilaterally needed to achieve correct containment and fit. Consider fitting with 32-chamber, sequential pneumatic compression device ([employer] Flexitouch "pump" ). The Flexitouch SPCD is the only sequential device that duplicates proximal to distal lymphatic decongestion to return interstitial fluid through inguinal and abdominal lymphatics to the heart. A basic pneumatic device is not appropriate for this patient because it does not treat deep abdominal lymphatics essential for transporting lymphatic congestion to the thoracic duct to return to circulation.       Patient will benefit from skilled therapeutic intervention in order to improve the following deficits and impairments:   Body Structure / Function / Physical Skills: ADL, Obesity, Decreased knowledge of precautions, Balance, Decreased knowledge of use of DME, Flexibility, IADL, Pain, Skin integrity, Edema, Mobility, ROM, Gait       Visit Diagnosis: Lymphedema, not elsewhere classified    Problem List Patient Active Problem List   Diagnosis Date Noted  . 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 Anxiety state 11/28/2009  . Depressive disorder 11/28/2009  . Essential hypertension 11/28/2009    Jaclyn Spearman, MS, OTR/L, [facility] 06/22/19 12:04 PM  [place] MAIN [facility] SERVICES [address] [place],  [place], [address] Phone: [phone]   Fax:  [phone]  Name: Jonelle Bann MRN: [id number] Date of Birth: Jun 15, 1955

## 2019-06-29 ENCOUNTER — Ambulatory Visit: Payer: Self-pay | Admitting: Occupational Therapy

## 2019-06-29 ENCOUNTER — Other Ambulatory Visit: Payer: Self-pay

## 2019-06-29 DIAGNOSIS — I890 Lymphedema, not elsewhere classified: Secondary | ICD-10-CM

## 2019-06-29 NOTE — Therapy (Signed)
[place] MAIN [facility] SERVICES [address] [place], [place], [address] Phone: [phone]   Fax:  [phone]  Occupational Therapy Treatment  Patient Details  Name: Jaclyn Kramer MRN: [id number] Date of Birth: [birth year] Referring Provider (OT): Linton Ham, MD   Encounter Date: 06/29/2019  OT End of Session - 06/29/19 1119    Visit Number  32    Number of Visits  36    Date for OT Re-Evaluation  09/15/19    Authorization Type  2021: 2nd visit    OT Start Time  1015    Activity Tolerance  Patient tolerated treatment well;No increased pain    Behavior During Therapy  [facility] for tasks assessed/performed       No past medical history on file.  No past surgical history on file.  There were no vitals filed for this visit.  Subjective Assessment - 06/29/19 1023    Subjective   Jaclyn Kramer presents for OT visit 32/36 to address BLE lymphedema. Pt arrives 3 hours early. OT able to see Pt early due to cancellation ;-)    Currently in Pain?  No/denies                   OT Treatments/Exercises (OP) - 06/29/19 0001      ADLs   ADL Education Given  Yes      Manual Therapy   Manual Therapy  Edema management;Manual Lymphatic Drainage (MLD);Compression Bandaging    Manual Lymphatic Drainage (MLD)  MLD to RLE/ LLQ utilizing short neck  sequence, deep  abdominal pathways and functional inguinal LN.    Compression Bandaging  compression wraps to RLE as established.              OT Education - 06/29/19 1118    Education Details  Continued skilled Pt/caregiver education  And LE ADL training throughout visit for lymphedema self care/ home program, including compression wrapping, compression garment and device wear/care, lymphatic pumping ther ex, simple self-MLD, and skin care. Discussed progress towards goals.    Person(s) Educated  Patient    Methods  Explanation;Demonstration;Handout;Tactile cues;Verbal cues    Comprehension   Verbalized understanding;Returned demonstration;Verbal cues required;Tactile cues required;Need further instruction          OT Long Term Goals - 06/17/19 1200      OT LONG TERM GOAL #1   Title  Pt will apply BLE, knee length, multi-layer, short stretch compression wraps daily using correct gradient techniques with min caregiver assistance     to achieve optimal limb volume reduction, to return affected limb , as closely as possible, to premorbid size and shape, to limit infection risk, and to improve safe functional ambulation and mobility.    Baseline  dependent    Time  4    Period  Days    Status  Achieved      OT LONG TERM GOAL #2   Title  Pt to achieve at least 10% BLE limb volume reductions below the knees during Intensive Phase CDT to increase  standing / walking tolerance , to increase functional performance of basic and instrumental ADLs, and to limit LE progression.    Baseline  dependent    Time  12    Period  Weeks    Status  Partially Met   06/17/19:  RLE additional reduction of 12.16% since 11/2.      OT LONG TERM GOAL #3   Title  Pt will be able to verbalize  signs and symptoms of cellulitis infection and identify 4 common lymphedema precautions using printed resource for reference (modified independence) to limit LE progression over time .    Baseline  Max A    Time  4    Period  Days    Status  Achieved      OT LONG TERM GOAL #4   Title  Pt will achieve and sustain at least 85%  compliance with daily LE self-care home program (skin care, lymphatic pumping therex, compression and simple self-MLD) ) with min CG assistance during Intensive Phase CDT  to limit  limb swelling, reduce infection risk,  limit associated pain , and limit LE progression.    Baseline  Dependent    Time  12    Period  Weeks    Status  Achieved      OT LONG TERM GOAL #5   Title  Once issued Pt will be able to don and doff appropriate compression garments and/ or devices using correct  techniques and assistive devices with extra time (modified independence) by end of Intensive Phase CDT for optimal LE management to limit progression over time.    Baseline  Max A    Time  12    Period  Weeks    Status  On-going      OT LONG TERM GOAL #6   Title  During self-management phase of CDT Pt will retain limb volume reductions achieved during Intensive Phase CDT with no more than 3% volume increase to limit LE progression and further functional decline.    Baseline  Max A    Time  6    Period  Months    Status  On-going            Plan - 06/29/19 1120    Clinical Impression Statement  Pt tolerated MLD, skin care and compression wraps to RLE today without difficulty. Replaced worn Ros[place]  foam. Pt reminded to be on time for pump fitting this Friday. Cont as per POC.    OT Occupational Profile and History  Comprehensive Assessment- Review of records and extensive additional review of physical, cognitive, psychosocial history related to current functional performance    Occupational performance deficits (Please refer to evaluation for details):  ADL's;Leisure;IADL's;Social Participation;Work;Other   parenting role, self esteme, body image   Body Structure / Function / Physical Skills  ADL;Obesity;Decreased knowledge of precautions;Balance;Decreased knowledge of use of DME;Flexibility;IADL;Pain;Skin integrity;Edema;Mobility;ROM;Gait    Rehab Potential  Good    Clinical Decision Making  Several treatment options, min-mod task modification necessary    Comorbidities Affecting Occupational Performance:  Presence of comorbidities impacting occupational performance    Comorbidities impacting occupational performance description:  See SUBJECTIVE    Modification or Assistance to Complete Evaluation   Min-Moderate modification of tasks or assist with assess necessary to complete eval    OT Frequency  2x / week    OT Duration  12 weeks   and PRN   OT Treatment/Interventions   Self-care/ADL training;Manual lymph drainage;Coping strategies training;Patient/family education;Compression bandaging;Therapist, nutritional;Therapeutic activities;Manual Therapy;DME and/or AE instruction;Other (comment)   myofacial release, skin care   Plan  treat one leg at a time to limit fall risk. Fit with custom compression garments bilaterally needed to achieve correct containment and fit. Consider fitting with 32-chamber, sequential pneumatic compression device ([employer] Flexitouch "pump" ). The Flexitouch SPCD is the only sequential device that duplicates proximal to distal lymphatic decongestion to return interstitial fluid through inguinal and abdominal  lymphatics to the heart. A basic pneumatic device is not appropriate for this patient because it does not treat deep abdominal lymphatics essential for transporting lymphatic congestion to the thoracic duct to return to circulation.       Patient will benefit from skilled therapeutic intervention in order to improve the following deficits and impairments:   Body Structure / Function / Physical Skills: ADL, Obesity, Decreased knowledge of precautions, Balance, Decreased knowledge of use of DME, Flexibility, IADL, Pain, Skin integrity, Edema, Mobility, ROM, Gait       Visit Diagnosis: Lymphedema, not elsewhere classified    Problem List Patient Active Problem List   Diagnosis Date Noted  . 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 Anxiety state 11/28/2009  . Depressive disorder 11/28/2009  . Essential hypertension 11/28/2009    Andrey Spearman, MS, OTR/L, [facility] 06/29/19 11:24 AM  [place] MAIN [facility] SERVICES [address] [place], [place], [address] Phone: [phone]   Fax:  [phone]  Name: Jaclyn Kramer MRN: [id number] Date of Birth: [birth year]

## 2019-07-01 ENCOUNTER — Encounter: Payer: Self-pay | Admitting: Occupational Therapy

## 2019-07-02 ENCOUNTER — Other Ambulatory Visit: Payer: Self-pay

## 2019-07-02 ENCOUNTER — Ambulatory Visit: Payer: Self-pay | Admitting: Occupational Therapy

## 2019-07-02 DIAGNOSIS — I890 Lymphedema, not elsewhere classified: Secondary | ICD-10-CM

## 2019-07-02 NOTE — Therapy (Signed)
[place] MAIN [facility] SERVICES [address] [place], [place], [address] Phone: [phone]   Fax:  [phone]  Occupational Therapy Treatment  Patient Details  Name: Jaclyn Kramer MRN: [id number] Date of Birth: November 10, 1955 Referring Provider (OT): Linton Ham, MD   Encounter Date: 07/02/2019  OT End of Session - 07/02/19 0942    Visit Number  33    Number of Visits  36    Date for OT Re-Evaluation  09/15/19    Authorization Type  2021: 2nd visit    OT Start Time  0915    Activity Tolerance  Patient tolerated treatment well;No increased pain    Behavior During Therapy  [facility] for tasks assessed/performed       No past medical history on file.  No past surgical history on file.  There were no vitals filed for this visit.  Subjective Assessment - 07/02/19 0932    Subjective   Jaclyn Kramer presents for OT visit 33/36 to address BLE lymphedema. Manufacture's rep from [employer] is here today to assist with compression pump fitting and training.                           OT Education - 07/02/19 0934    Education Details  Skilled Pt edu  for care and use of Flexitouch sequential pneumatic compression device (pump), Pt educated on garment donning, doffing and fitting, precautions for NOT using pump, including cellulitis, or suspected infection, blood clot, SOB or acute pain with pump use. Pt instructed on pump set up and use. Set compression to 3--40 mmHg. Good return.    Person(s) Educated  Patient    Methods  Explanation;Demonstration;Handout;Tactile cues;Verbal cues    Comprehension  Verbalized understanding;Returned demonstration;Verbal cues required;Tactile cues required;Need further instruction          OT Long Term Goals - 06/17/19 1200      OT LONG TERM GOAL #1   Title  Pt will apply BLE, knee length, multi-layer, short stretch compression wraps daily using correct gradient techniques with min caregiver  assistance     to achieve optimal limb volume reduction, to return affected limb , as closely as possible, to premorbid size and shape, to limit infection risk, and to improve safe functional ambulation and mobility.    Baseline  dependent    Time  4    Period  Days    Status  Achieved      OT LONG TERM GOAL #2   Title  Pt to achieve at least 10% BLE limb volume reductions below the knees during Intensive Phase CDT to increase  standing / walking tolerance , to increase functional performance of basic and instrumental ADLs, and to limit LE progression.    Baseline  dependent    Time  12    Period  Weeks    Status  Partially Met   06/17/19:  RLE additional reduction of 12.16% since 11/2.      OT LONG TERM GOAL #3   Title  Pt will be able to verbalize signs and symptoms of cellulitis infection and identify 4 common lymphedema precautions using printed resource for reference (modified independence) to limit LE progression over time .    Baseline  Max A    Time  4    Period  Days    Status  Achieved      OT LONG TERM GOAL #4   Title  Pt will  achieve and sustain at least 85%  compliance with daily LE self-care home program (skin care, lymphatic pumping therex, compression and simple self-MLD) ) with min CG assistance during Intensive Phase CDT  to limit  limb swelling, reduce infection risk,  limit associated pain , and limit LE progression.    Baseline  Dependent    Time  12    Period  Weeks    Status  Achieved      OT LONG TERM GOAL #5   Title  Once issued Pt will be able to don and doff appropriate compression garments and/ or devices using correct techniques and assistive devices with extra time (modified independence) by end of Intensive Phase CDT for optimal LE management to limit progression over time.    Baseline  Max A    Time  12    Period  Weeks    Status  On-going      OT LONG TERM GOAL #6   Title  During self-management phase of CDT Pt will retain limb volume reductions  achieved during Intensive Phase CDT with no more than 3% volume increase to limit LE progression and further functional decline.    Baseline  Max A    Time  6    Period  Months    Status  On-going            Plan - 07/02/19 0942    Clinical Impression Statement  Skilled Pt edu  for care and use of Flexitouch sequential pneumatic compression device (pump), Pt educated on garment donning, doffing and fitting, precautions for NOT using pump, including cellulitis, or suspected infection, blood clot, SOB or acute pain with pump use. Pt instructed on pump set up and use. Set compression to 3--40 mmHg. Good return. Cont as per POC.    OT Occupational Profile and History  Comprehensive Assessment- Review of records and extensive additional review of physical, cognitive, psychosocial history related to current functional performance    Occupational performance deficits (Please refer to evaluation for details):  ADL's;Leisure;IADL's;Social Participation;Work;Other   parenting role, self esteme, body image   Body Structure / Function / Physical Skills  ADL;Obesity;Decreased knowledge of precautions;Balance;Decreased knowledge of use of DME;Flexibility;IADL;Pain;Skin integrity;Edema;Mobility;ROM;Gait    Rehab Potential  Good    Clinical Decision Making  Several treatment options, min-mod task modification necessary    Comorbidities Affecting Occupational Performance:  Presence of comorbidities impacting occupational performance    Comorbidities impacting occupational performance description:  See SUBJECTIVE    Modification or Assistance to Complete Evaluation   Min-Moderate modification of tasks or assist with assess necessary to complete eval    OT Frequency  2x / week    OT Duration  12 weeks   and PRN   OT Treatment/Interventions  Self-care/ADL training;Manual lymph drainage;Coping strategies training;Patient/family education;Compression bandaging;Therapist, nutritional;Therapeutic  activities;Manual Therapy;DME and/or AE instruction;Other (comment)   myofacial release, skin care   Plan  treat one leg at a time to limit fall risk. Fit with custom compression garments bilaterally needed to achieve correct containment and fit. Consider fitting with 32-chamber, sequential pneumatic compression device ([employer] Flexitouch "pump" ). The Flexitouch SPCD is the only sequential device that duplicates proximal to distal lymphatic decongestion to return interstitial fluid through inguinal and abdominal lymphatics to the heart. A basic pneumatic device is not appropriate for this patient because it does not treat deep abdominal lymphatics essential for transporting lymphatic congestion to the thoracic duct to return to circulation.  Patient will benefit from skilled therapeutic intervention in order to improve the following deficits and impairments:   Body Structure / Function / Physical Skills: ADL, Obesity, Decreased knowledge of precautions, Balance, Decreased knowledge of use of DME, Flexibility, IADL, Pain, Skin integrity, Edema, Mobility, ROM, Gait       Visit Diagnosis: Lymphedema, not elsewhere classified    Problem List Patient Active Problem List   Diagnosis Date Noted  . 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 Anxiety state 11/28/2009  . Depressive disorder 11/28/2009  . Essential hypertension 11/28/2009    Andrey Spearman, MS, OTR/L, [facility] 07/02/19 9:46 AM  [place] MAIN [facility] SERVICES [address] [place], [place], [address] Phone: [phone]   Fax:  [phone]  Name: Tyianna Menefee MRN: [id number] Date of Birth: [birth year]

## 2019-07-05 ENCOUNTER — Ambulatory Visit: Payer: Self-pay | Admitting: Occupational Therapy

## 2019-07-05 ENCOUNTER — Other Ambulatory Visit: Payer: Self-pay

## 2019-07-05 DIAGNOSIS — I890 Lymphedema, not elsewhere classified: Secondary | ICD-10-CM

## 2019-07-05 NOTE — Therapy (Signed)
[place] MAIN [facility] SERVICES [address] [place], [place], [address] Phone: [phone]   Fax:  [phone]  Occupational Therapy Treatment  Patient Details  Name: Jaclyn Kramer MRN: [id number] Date of Birth: Aug 29, 1955 Referring Provider (OT): Linton Ham, MD   Encounter Date: 07/05/2019  OT End of Session - 07/05/19 1430    Visit Number  34    Number of Visits  36    Date for OT Re-Evaluation  09/15/19    Authorization Type  --    OT Start Time  0118    OT Stop Time  0230    OT Time Calculation (min)  72 min    Activity Tolerance  Patient tolerated treatment well;No increased pain    Behavior During Therapy  [facility] for tasks assessed/performed       No past medical history on file.  No past surgical history on file.  There were no vitals filed for this visit.  Subjective Assessment - 07/05/19 1326    Subjective   Jaclyn Kramer presents for OT visit 34/36 to address BLE lymphedema. Pt arrives 18 minutes late for a 60 minute session.Pt c/o pain in R foot callus and R  hand 3rd metatarsal. Pt encouraged to call her podiatrist to limit infection risk in risk.                           OT Education - 07/05/19 1429    Education Details  Continued skilled Pt/caregiver education  And LE ADL training throughout visit for lymphedema self care/ home program, including compression wrapping, compression garment and device wear/care, lymphatic pumping ther ex, simple self-MLD, and skin care. Discussed progress towards goals.    Person(s) Educated  Patient    Methods  Explanation;Demonstration;Handout;Tactile cues;Verbal cues    Comprehension  Verbalized understanding;Returned demonstration;Verbal cues required;Tactile cues required;Need further instruction          OT Long Term Goals - 06/17/19 1200      OT LONG TERM GOAL #1   Title  Pt will apply BLE, knee length, multi-layer, short stretch compression wraps daily  using correct gradient techniques with min caregiver assistance     to achieve optimal limb volume reduction, to return affected limb , as closely as possible, to premorbid size and shape, to limit infection risk, and to improve safe functional ambulation and mobility.    Baseline  dependent    Time  4    Period  Days    Status  Achieved      OT LONG TERM GOAL #2   Title  Pt to achieve at least 10% BLE limb volume reductions below the knees during Intensive Phase CDT to increase  standing / walking tolerance , to increase functional performance of basic and instrumental ADLs, and to limit LE progression.    Baseline  dependent    Time  12    Period  Weeks    Status  Partially Met   06/17/19:  RLE additional reduction of 12.16% since 11/2.      OT LONG TERM GOAL #3   Title  Pt will be able to verbalize signs and symptoms of cellulitis infection and identify 4 common lymphedema precautions using printed resource for reference (modified independence) to limit LE progression over time .    Baseline  Max A    Time  4    Period  Days    Status  Achieved  OT LONG TERM GOAL #4   Title  Pt will achieve and sustain at least 85%  compliance with daily LE self-care home program (skin care, lymphatic pumping therex, compression and simple self-MLD) ) with min CG assistance during Intensive Phase CDT  to limit  limb swelling, reduce infection risk,  limit associated pain , and limit LE progression.    Baseline  Dependent    Time  12    Period  Weeks    Status  Achieved      OT LONG TERM GOAL #5   Title  Once issued Pt will be able to don and doff appropriate compression garments and/ or devices using correct techniques and assistive devices with extra time (modified independence) by end of Intensive Phase CDT for optimal LE management to limit progression over time.    Baseline  Max A    Time  12    Period  Weeks    Status  On-going      OT LONG TERM GOAL #6   Title  During self-management  phase of CDT Pt will retain limb volume reductions achieved during Intensive Phase CDT with no more than 3% volume increase to limit LE progression and further functional decline.    Baseline  Max A    Time  6    Period  Months    Status  On-going            Plan - 07/05/19 1443    Clinical Impression Statement  Pt encouraged to set up sequential pnumatic Flexitouch at home and  start using the device ASAP so we can troubleshoot any issues she might identify, and so she can build confidence   using the machine. Pt tells me she plans to set up the device          "this weekend". Pt tolerated RLE MLD, skin care and compression wrapping without difficulty. RLE appears thouroughly decongested and skin condition is 85% improved in hydration and mobility. OT emailed DME vendor asking for status update on RLE garment Remake. Dleayed delivery and fitting is prolonging OT for CDT. Cont as per POC.    OT Occupational Profile and History  Comprehensive Assessment- Review of records and extensive additional review of physical, cognitive, psychosocial history related to current functional performance    Occupational performance deficits (Please refer to evaluation for details):  ADL's;Leisure;IADL's;Social Participation;Work;Other   parenting role, self esteme, body image   Body Structure / Function / Physical Skills  ADL;Obesity;Decreased knowledge of precautions;Balance;Decreased knowledge of use of DME;Flexibility;IADL;Pain;Skin integrity;Edema;Mobility;ROM;Gait    Rehab Potential  Good    Clinical Decision Making  Several treatment options, min-mod task modification necessary    Comorbidities Affecting Occupational Performance:  Presence of comorbidities impacting occupational performance    Comorbidities impacting occupational performance description:  See SUBJECTIVE    Modification or Assistance to Complete Evaluation   Min-Moderate modification of tasks or assist with assess necessary to complete  eval    OT Frequency  2x / week    OT Duration  12 weeks   and PRN   OT Treatment/Interventions  Self-care/ADL training;Manual lymph drainage;Coping strategies training;Patient/family education;Compression bandaging;Therapist, nutritional;Therapeutic activities;Manual Therapy;DME and/or AE instruction;Other (comment)   myofacial release, skin care   Plan  treat one leg at a time to limit fall risk. Fit with custom compression garments bilaterally needed to achieve correct containment and fit. Consider fitting with 32-chamber, sequential pneumatic compression device ([employer] Flexitouch "pump" ). The Flexitouch SPCD is  the only sequential device that duplicates proximal to distal lymphatic decongestion to return interstitial fluid through inguinal and abdominal lymphatics to the heart. A basic pneumatic device is not appropriate for this patient because it does not treat deep abdominal lymphatics essential for transporting lymphatic congestion to the thoracic duct to return to circulation.       Patient will benefit from skilled therapeutic intervention in order to improve the following deficits and impairments:   Body Structure / Function / Physical Skills: ADL, Obesity, Decreased knowledge of precautions, Balance, Decreased knowledge of use of DME, Flexibility, IADL, Pain, Skin integrity, Edema, Mobility, ROM, Gait       Visit Diagnosis: Lymphedema, not elsewhere classified    Problem List Patient Active Problem List   Diagnosis Date Noted  . 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 Anxiety state 11/28/2009  . Depressive disorder 11/28/2009  . Essential hypertension 11/28/2009    Andrey Spearman, MS, OTR/L, [facility] 07/05/19 3:19 PM  [place] MAIN [facility] SERVICES [address] [place], [place], [address] Phone: [phone]   Fax:  [phone]  Name: Alaiah Lundy MRN: [id number] Date of Birth: 23-Mar-1956

## 2019-07-06 ENCOUNTER — Ambulatory Visit: Payer: Self-pay | Admitting: Occupational Therapy

## 2019-07-08 ENCOUNTER — Other Ambulatory Visit: Payer: Self-pay

## 2019-07-08 ENCOUNTER — Ambulatory Visit: Payer: Self-pay | Admitting: Occupational Therapy

## 2019-07-08 DIAGNOSIS — I890 Lymphedema, not elsewhere classified: Secondary | ICD-10-CM

## 2019-07-08 NOTE — Therapy (Signed)
[place] MAIN [facility] SERVICES [address] [place], [place], [address] Phone: [phone]   Fax:  [phone]  Occupational Therapy Treatment  Patient Details  Name: Jaclyn Kramer MRN: [id number] Date of Birth: [birth year] Referring Provider (OT): Linton Ham, MD   Encounter Date: 07/08/2019  OT End of Session - 07/08/19 1320    Visit Number  35    Number of Visits  36    Date for OT Re-Evaluation  09/15/19    OT Start Time  0105    Activity Tolerance  Patient tolerated treatment well;No increased pain    Behavior During Therapy  [facility] for tasks assessed/performed       No past medical history on file.  No past surgical history on file.  There were no vitals filed for this visit.  Subjective Assessment - 07/08/19 1318    Subjective   Jaclyn Kramer presents for OT visit 35/36 to address BLE lymphedema. Pt is on time. OT is late today . Pt as no new complaints. .                   OT Treatments/Exercises (OP) - 07/08/19 0001      ADLs   ADL Education Given  Yes      Manual Therapy   Manual Therapy  Edema management    Manual Lymphatic Drainage (MLD)  MLD to RLE/ LLQ utilizing short neck  sequence, deep  abdominal pathways and functional inguinal LN.    Compression Bandaging  compression wraps to RLE as established.              OT Education - 07/08/19 1320    Education Details  Continued skilled Pt/caregiver education  And LE ADL training throughout visit for lymphedema self care/ home program, including compression wrapping, compression garment and device wear/care, lymphatic pumping ther ex, simple self-MLD, and skin care. Discussed progress towards goals.    Person(s) Educated  Patient    Methods  Explanation;Demonstration;Handout;Tactile cues;Verbal cues    Comprehension  Verbalized understanding;Returned demonstration;Verbal cues required;Tactile cues required;Need further instruction          OT Long  Term Goals - 06/17/19 1200      OT LONG TERM GOAL #1   Title  Pt will apply BLE, knee length, multi-layer, short stretch compression wraps daily using correct gradient techniques with min caregiver assistance     to achieve optimal limb volume reduction, to return affected limb , as closely as possible, to premorbid size and shape, to limit infection risk, and to improve safe functional ambulation and mobility.    Baseline  dependent    Time  4    Period  Days    Status  Achieved      OT LONG TERM GOAL #2   Title  Pt to achieve at least 10% BLE limb volume reductions below the knees during Intensive Phase CDT to increase  standing / walking tolerance , to increase functional performance of basic and instrumental ADLs, and to limit LE progression.    Baseline  dependent    Time  12    Period  Weeks    Status  Partially Met   06/17/19:  RLE additional reduction of 12.16% since 11/2.      OT LONG TERM GOAL #3   Title  Pt will be able to verbalize signs and symptoms of cellulitis infection and identify 4 common lymphedema precautions using printed resource for reference (modified independence) to limit  LE progression over time .    Baseline  Max A    Time  4    Period  Days    Status  Achieved      OT LONG TERM GOAL #4   Title  Pt will achieve and sustain at least 85%  compliance with daily LE self-care home program (skin care, lymphatic pumping therex, compression and simple self-MLD) ) with min CG assistance during Intensive Phase CDT  to limit  limb swelling, reduce infection risk,  limit associated pain , and limit LE progression.    Baseline  Dependent    Time  12    Period  Weeks    Status  Achieved      OT LONG TERM GOAL #5   Title  Once issued Pt will be able to don and doff appropriate compression garments and/ or devices using correct techniques and assistive devices with extra time (modified independence) by end of Intensive Phase CDT for optimal LE management to limit  progression over time.    Baseline  Max A    Time  12    Period  Weeks    Status  On-going      OT LONG TERM GOAL #6   Title  During self-management phase of CDT Pt will retain limb volume reductions achieved during Intensive Phase CDT with no more than 3% volume increase to limit LE progression and further functional decline.    Baseline  Max A    Time  6    Period  Months    Status  On-going            Plan - 07/08/19 1400    Clinical Impression Statement  Pt used sequential pneumatic device on RLE during visit interval. Pt reports she was able to don device garments and operate machine as directed without difficulty. Pt tolerated manual therapy, skin care and RLE compression wrapping today without difficulty. We are hoping remake of custom RLE garment arrives before next visit. Pt tolerated manual therapy, including compression and MLD without difficulty. Cont as per POC.    OT Occupational Profile and History  Comprehensive Assessment- Review of records and extensive additional review of physical, cognitive, psychosocial history related to current functional performance    Occupational performance deficits (Please refer to evaluation for details):  ADL's;Leisure;IADL's;Social Participation;Work;Other   parenting role, self esteme, body image   Body Structure / Function / Physical Skills  ADL;Obesity;Decreased knowledge of precautions;Balance;Decreased knowledge of use of DME;Flexibility;IADL;Pain;Skin integrity;Edema;Mobility;ROM;Gait    Rehab Potential  Good    Clinical Decision Making  Several treatment options, min-mod task modification necessary    Comorbidities Affecting Occupational Performance:  Presence of comorbidities impacting occupational performance    Comorbidities impacting occupational performance description:  See SUBJECTIVE    Modification or Assistance to Complete Evaluation   Min-Moderate modification of tasks or assist with assess necessary to complete eval     OT Frequency  2x / week    OT Duration  12 weeks   and PRN   OT Treatment/Interventions  Self-care/ADL training;Manual lymph drainage;Coping strategies training;Patient/family education;Compression bandaging;Therapist, nutritional;Therapeutic activities;Manual Therapy;DME and/or AE instruction;Other (comment)   myofacial release, skin care   Plan  treat one leg at a time to limit fall risk. Fit with custom compression garments bilaterally needed to achieve correct containment and fit. Consider fitting with 32-chamber, sequential pneumatic compression device ([employer] Flexitouch "pump" ). The Flexitouch SPCD is the only sequential device that duplicates proximal to  distal lymphatic decongestion to return interstitial fluid through inguinal and abdominal lymphatics to the heart. A basic pneumatic device is not appropriate for this patient because it does not treat deep abdominal lymphatics essential for transporting lymphatic congestion to the thoracic duct to return to circulation.       Patient will benefit from skilled therapeutic intervention in order to improve the following deficits and impairments:   Body Structure / Function / Physical Skills: ADL, Obesity, Decreased knowledge of precautions, Balance, Decreased knowledge of use of DME, Flexibility, IADL, Pain, Skin integrity, Edema, Mobility, ROM, Gait       Visit Diagnosis: Lymphedema, not elsewhere classified    Problem List Patient Active Problem List   Diagnosis Date Noted  . 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 Anxiety state 11/28/2009  . Depressive disorder 11/28/2009  . Essential hypertension 11/28/2009    Andrey Spearman, MS, OTR/L, [facility] 07/08/19 4:19 PM  [place] MAIN [facility] SERVICES [address] [place], [place], [address] Phone: [phone]   Fax:  [phone]  Name: Jaclyn Kramer MRN: [id number] Date of Birth: 1955/10/13

## 2019-07-12 ENCOUNTER — Other Ambulatory Visit: Payer: Self-pay

## 2019-07-12 ENCOUNTER — Ambulatory Visit: Payer: Self-pay | Attending: Internal Medicine | Admitting: Occupational Therapy

## 2019-07-12 DIAGNOSIS — I890 Lymphedema, not elsewhere classified: Secondary | ICD-10-CM | POA: Insufficient documentation

## 2019-07-12 NOTE — Therapy (Signed)
[place] MAIN [facility] SERVICES [address] [place], [place], [address] Phone: [phone]   Fax:  [phone]  Occupational Therapy Treatment Note, Progress Report, and Re-certification Lymphedema Care  Patient Details  Name: Jaclyn Kramer MRN: [id number] Date of Birth: Dec 15, 1955 Referring Provider (OT): Linton Ham, MD   Encounter Date: 07/12/2019  OT End of Session - 07/12/19 1525    Visit Number  36    Number of Visits  36    Date for OT Re-Evaluation  10/10/19    OT Start Time  0125    OT Stop Time  0220    OT Time Calculation (min)  55 min    Activity Tolerance  Patient tolerated treatment well;No increased pain    Behavior During Therapy  [facility] for tasks assessed/performed       No past medical history on file.  No past surgical history on file.  There were no vitals filed for this visit.  Subjective Assessment - 07/12/19 1410    Subjective   Jaclyn Kramer presents for OT visit 36/36 to address BLE lymphedema. Pt brings remade RLE custom knee length compression garment for fitting.    Currently in Pain?  No/denies          LYMPHEDEMA/ONCOLOGY QUESTIONNAIRE - 07/12/19 1412      Right Lower Extremity Lymphedema   Other  RLE A-D= 4237.44 ml    Other  RLE A-D limb volume is decreased by 4.67% since last measured on 06/17/19. RLE A-D volume is decreased by 34.21% overall since commencing OT on 03/17/19              OT Treatments/Exercises (OP) - 07/12/19 0001      ADLs   ADL Education Given  Yes      Manual Therapy   Manual Therapy  Edema management;Compression Bandaging    Manual therapy comments  RLE final limb volumetrics    Edema Management  custom garment fitting and assessment    Compression Bandaging  Commenced LLE, multilayer compression wrapping as established using gradient techniques to LLE.             OT Education - 07/12/19 1525    Education Details  Continued skilled Pt/caregiver education   And LE ADL training throughout visit for lymphedema self care/ home program, including compression wrapping, compression garment and device wear/care, lymphatic pumping ther ex, simple self-MLD, and skin care. Discussed progress towards goals.    Person(s) Educated  Patient    Methods  Explanation;Demonstration;Handout;Tactile cues;Verbal cues    Comprehension  Verbalized understanding;Returned demonstration;Verbal cues required;Tactile cues required;Need further instruction          OT Long Term Goals - 07/12/19 1500      OT LONG TERM GOAL #1   Title  Pt will apply BLE, knee length, multi-layer, short stretch compression wraps daily using correct gradient techniques with min caregiver assistance     to achieve optimal limb volume reduction, to return affected limb , as closely as possible, to premorbid size and shape, to limit infection risk, and to improve safe functional ambulation and mobility.    Status  Achieved      OT LONG TERM GOAL #2   Title  Pt to achieve at least 10% BLE limb volume reductions below the knees during Intensive Phase CDT to increase  standing / walking tolerance , to increase functional performance of basic and instrumental ADLs, and to limit LE progression.      OT  LONG TERM GOAL #3   Status  --   Met for RLE with 34% limb volume reduction below the knee     OT LONG TERM GOAL #4   Title  Pt will achieve and sustain at least 85%  compliance with daily LE self-care home program (skin care, lymphatic pumping therex, compression and simple self-MLD) ) with min CG assistance during Intensive Phase CDT  to limit  limb swelling, reduce infection risk,  limit associated pain , and limit LE progression.    Status  Achieved      OT LONG TERM GOAL #5   Title  Once issued Pt will be able to don and doff appropriate compression garments and/ or devices using correct techniques and assistive devices with extra time (modified independence) by end of Intensive Phase CDT for  optimal LE management to limit progression over time.    Status  Achieved      OT LONG TERM GOAL #6   Title  During self-management phase of CDT Pt will retain limb volume reductions achieved during Intensive Phase CDT with no more than 3% volume increase to limit LE progression and further functional decline.    Status  On-going            Plan - 07/12/19 1526    Clinical Impression Statement  Completed RLE comparative limb volumetrics. RLE A-D limb volume is decreased by 4.67% since last measured on 06/17/19. RLE A-D volume is decreased by 34.21% overall since commencing OT on 03/17/19. This value meets and dramatically exceeds the initial 10% limb volume reduction goal for the RLE below the knee. Skin condition is 85% improved  with excellent hydration , mobility and 50% decreased tissue fibrosis. Infection risk is decreased as a result. Pt is prous of the way the RLE looks, which has led to improved body image.Remade RLE custom, flat knit, ccl 3 ( flat knit ccl3 = 35-45 mmHg) fits beautifully and Pt expresses comfort in garment. Commenced LLE CDT today as we transition RLE to management phase CDT. Pt leaves clinic with LLE knee length wrap in place. Cont CDT 2 x weekly to LLE. Estimate 6-12 weeks to achieve clinical goals.    OT Occupational Profile and History  Comprehensive Assessment- Review of records and extensive additional review of physical, cognitive, psychosocial history related to current functional performance    Occupational performance deficits (Please refer to evaluation for details):  ADL's;Leisure;IADL's;Social Participation;Work;Other   parenting role, self esteme, body image   Body Structure / Function / Physical Skills  ADL;Obesity;Decreased knowledge of precautions;Balance;Decreased knowledge of use of DME;Flexibility;IADL;Pain;Skin integrity;Edema;Mobility;ROM;Gait    Rehab Potential  Good    Clinical Decision Making  Several treatment options, min-mod task modification  necessary    Comorbidities Affecting Occupational Performance:  Presence of comorbidities impacting occupational performance    Comorbidities impacting occupational performance description:  See SUBJECTIVE    Modification or Assistance to Complete Evaluation   Min-Moderate modification of tasks or assist with assess necessary to complete eval    OT Frequency  2x / week    OT Duration  12 weeks   and PRN   OT Treatment/Interventions  Self-care/ADL training;Manual lymph drainage;Coping strategies training;Patient/family education;Compression bandaging;Therapist, nutritional;Therapeutic activities;Manual Therapy;DME and/or AE instruction;Other (comment)   myofacial release, skin care   Plan  treat one leg at a time to limit fall risk. Fit with custom compression garments bilaterally needed to achieve correct containment and fit. Consider fitting with 32-chamber, sequential pneumatic compression device (Tactile  Medical Flexitouch "pump" ). The Flexitouch SPCD is the only sequential device that duplicates proximal to distal lymphatic decongestion to return interstitial fluid through inguinal and abdominal lymphatics to the heart. A basic pneumatic device is not appropriate for this patient because it does not treat deep abdominal lymphatics essential for transporting lymphatic congestion to the thoracic duct to return to circulation.       Patient will benefit from skilled therapeutic intervention in order to improve the following deficits and impairments:   Body Structure / Function / Physical Skills: ADL, Obesity, Decreased knowledge of precautions, Balance, Decreased knowledge of use of DME, Flexibility, IADL, Pain, Skin integrity, Edema, Mobility, ROM, Gait       Visit Diagnosis: Lymphedema, not elsewhere classified - Plan: Ot plan of care cert/re-cert    Problem List Patient Active Problem List   Diagnosis Date Noted  . 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 Anxiety state 11/28/2009  .  Depressive disorder 11/28/2009  . Essential hypertension 11/28/2009    Andrey Spearman, MS, OTR/L, [facility] 07/12/19 3:39 PM  [place] MAIN [facility] SERVICES [address] [place], [place], [address] Phone: [phone]   Fax:  [phone]  Name: Jaclyn Kramer MRN: [id number] Date of Birth: 07-18-1955

## 2019-07-13 ENCOUNTER — Ambulatory Visit: Payer: Self-pay | Admitting: Occupational Therapy

## 2019-07-14 ENCOUNTER — Encounter: Payer: Self-pay | Admitting: Occupational Therapy

## 2019-07-15 ENCOUNTER — Ambulatory Visit: Payer: Self-pay | Admitting: Occupational Therapy

## 2019-07-20 ENCOUNTER — Ambulatory Visit: Payer: Self-pay | Admitting: Occupational Therapy

## 2019-07-20 ENCOUNTER — Other Ambulatory Visit: Payer: Self-pay

## 2019-07-20 DIAGNOSIS — I890 Lymphedema, not elsewhere classified: Secondary | ICD-10-CM

## 2019-07-21 ENCOUNTER — Encounter: Payer: Self-pay | Admitting: Occupational Therapy

## 2019-07-21 NOTE — Therapy (Signed)
[place] MAIN [facility] SERVICES [address] [place], [place], [address] Phone: [phone]   Fax:  [phone]  Occupational Therapy Treatment  Patient Details  Name: Jaclyn Kramer MRN: [id number] Date of Birth: 03-Aug-1955 Referring Provider (OT): Linton Ham, MD   Encounter Date: 07/20/2019  OT End of Session - 07/20/19 1331    Visit Number  37    Number of Visits  78    OT Start Time  0130    OT Stop Time  0230    OT Time Calculation (min)  60 min       History reviewed. No pertinent past medical history.  History reviewed. No pertinent surgical history.  There were no vitals filed for this visit.  Subjective Assessment - 07/20/19 1455    Subjective   Jaclyn Kramer presents for OT visit 37/72 to address BLE lymphedema. Pt is on time for appointment. Pt reports 8/10 pain in bottom of R foo at callous. "I washed my stocking and dried it and put it on just like you told me." Pt reports R remade stocking is comfortable and does not slide down.                           OT Education - 07/20/19 0831    Education Details  Continued skilled Pt/caregiver education  And LE ADL training throughout visit for lymphedema self care/ home program, including compression wrapping, compression garment and device wear/care, lymphatic pumping ther ex, simple self-MLD, and skin care. Discussed progress towards goals.    Person(s) Educated  Patient    Methods  Explanation;Demonstration;Handout;Tactile cues;Verbal cues    Comprehension  Verbalized understanding;Returned demonstration;Verbal cues required;Tactile cues required;Need further instruction          OT Long Term Goals - 07/12/19 1500      OT LONG TERM GOAL #1   Title  Pt will apply BLE, knee length, multi-layer, short stretch compression wraps daily using correct gradient techniques with min caregiver assistance     to achieve optimal limb volume reduction, to return  affected limb , as closely as possible, to premorbid size and shape, to limit infection risk, and to improve safe functional ambulation and mobility.    Status  Achieved      OT LONG TERM GOAL #2   Title  Pt to achieve at least 10% BLE limb volume reductions below the knees during Intensive Phase CDT to increase  standing / walking tolerance , to increase functional performance of basic and instrumental ADLs, and to limit LE progression.      OT LONG TERM GOAL #3   Status  --   Met for RLE with 34% limb volume reduction below the knee     OT LONG TERM GOAL #4   Title  Pt will achieve and sustain at least 85%  compliance with daily LE self-care home program (skin care, lymphatic pumping therex, compression and simple self-MLD) ) with min CG assistance during Intensive Phase CDT  to limit  limb swelling, reduce infection risk,  limit associated pain , and limit LE progression.    Status  Achieved      OT LONG TERM GOAL #5   Title  Once issued Pt will be able to don and doff appropriate compression garments and/ or devices using correct techniques and assistive devices with extra time (modified independence) by end of Intensive Phase CDT for optimal LE management to limit progression  over time.    Status  Achieved      OT LONG TERM GOAL #6   Title  During self-management phase of CDT Pt will retain limb volume reductions achieved during Intensive Phase CDT with no more than 3% volume increase to limit LE progression and further functional decline.    Status  On-going            Plan - 07/20/19 3664    Clinical Impression Statement  LLE limb volume is visibly decreased since last visit. Skin wrinkles and losened skin is observed at ankle and distal leg. Pt cont to demonstrate excellent compliance with all home program elements, including skin care, ther ex, pump use, and compression . RLE custom garment fits well, is comfortable, and appears to be controlling and containing swelling very  well. Pt is modified independent with donning / doffing using friction gloves. Cont as per POC.    OT Occupational Profile and History  Comprehensive Assessment- Review of records and extensive additional review of physical, cognitive, psychosocial history related to current functional performance    Occupational performance deficits (Please refer to evaluation for details):  ADL's;Leisure;IADL's;Social Participation;Work;Other   parenting role, self esteme, body image   Body Structure / Function / Physical Skills  ADL;Obesity;Decreased knowledge of precautions;Balance;Decreased knowledge of use of DME;Flexibility;IADL;Pain;Skin integrity;Edema;Mobility;ROM;Gait    Rehab Potential  Good    Clinical Decision Making  Several treatment options, min-mod task modification necessary    Comorbidities Affecting Occupational Performance:  Presence of comorbidities impacting occupational performance    Comorbidities impacting occupational performance description:  See SUBJECTIVE    Modification or Assistance to Complete Evaluation   Min-Moderate modification of tasks or assist with assess necessary to complete eval    OT Frequency  2x / week    OT Duration  12 weeks   and PRN   OT Treatment/Interventions  Self-care/ADL training;Manual lymph drainage;Coping strategies training;Patient/family education;Compression bandaging;Therapist, nutritional;Therapeutic activities;Manual Therapy;DME and/or AE instruction;Other (comment)   myofacial release, skin care   Plan  treat one leg at a time to limit fall risk. Fit with custom compression garments bilaterally needed to achieve correct containment and fit. Consider fitting with 32-chamber, sequential pneumatic compression device (Tactile Medical Flexitouch "pump" ). The Flexitouch SPCD is the only sequential device that duplicates proximal to distal lymphatic decongestion to return interstitial fluid through inguinal and abdominal lymphatics to the heart. A basic  pneumatic device is not appropriate for this patient because it does not treat deep abdominal lymphatics essential for transporting lymphatic congestion to the thoracic duct to return to circulation.       Patient will benefit from skilled therapeutic intervention in order to improve the following deficits and impairments:   Body Structure / Function / Physical Skills: ADL, Obesity, Decreased knowledge of precautions, Balance, Decreased knowledge of use of DME, Flexibility, IADL, Pain, Skin integrity, Edema, Mobility, ROM, Gait       Visit Diagnosis: Lymphedema, not elsewhere classified    Problem List Patient Active Problem List   Diagnosis Date Noted  . 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 Anxiety state 11/28/2009  . Depressive disorder 11/28/2009  . Essential hypertension 11/28/2009    Andrey Spearman, MS, OTR/L, [facility] 07/21/19 8:36 AM  [place] MAIN [facility] SERVICES [address] [place], [place], [address] Phone: [phone]   Fax:  [phone]  Name: Jaclyn Kramer MRN: [id number] Date of Birth: 1956/03/13

## 2019-07-22 ENCOUNTER — Ambulatory Visit (INDEPENDENT_AMBULATORY_CARE_PROVIDER_SITE_OTHER): Payer: Self-pay | Admitting: Podiatry

## 2019-07-22 ENCOUNTER — Encounter: Payer: Self-pay | Admitting: Podiatry

## 2019-07-22 ENCOUNTER — Other Ambulatory Visit: Payer: Self-pay

## 2019-07-22 ENCOUNTER — Ambulatory Visit: Payer: Self-pay | Admitting: Occupational Therapy

## 2019-07-22 DIAGNOSIS — I890 Lymphedema, not elsewhere classified: Secondary | ICD-10-CM

## 2019-07-22 DIAGNOSIS — Q828 Other specified congenital malformations of skin: Secondary | ICD-10-CM

## 2019-07-22 NOTE — Therapy (Signed)
[place] MAIN [facility] SERVICES [address] [place], [place], [address] Phone: [phone]   Fax:  [phone]  Occupational Therapy Treatment  Patient Details  Name: Jaclyn Kramer MRN: [id number] Date of Birth: 1955/07/18 Referring Provider (OT): Linton Ham, MD   Encounter Date: 07/22/2019  OT End of Session - 07/22/19 1308    Visit Number  38    Number of Visits  72    Date for OT Re-Evaluation  10/10/19    OT Start Time  0100    OT Stop Time  0200    OT Time Calculation (min)  60 min       No past medical history on file.  No past surgical history on file.  There were no vitals filed for this visit.  Subjective Assessment - 07/22/19 1310    Subjective   Jaclyn Kramer presents for OT visit 38/72 to address BLE lymphedema. Pt reports pain in callous on bottom of her R foot is 8/10. "I'm going to the foot doctor later today.                   OT Treatments/Exercises (OP) - 07/22/19 0001      ADLs   ADL Education Given  Yes      Manual Therapy   Manual Therapy  Edema management    Manual Lymphatic Drainage (MLD)  MLD to LLE as established    Compression Bandaging  LLE MLD as established             OT Education - 07/22/19 1312    Education Details  Continued skilled Pt/caregiver education  And LE ADL training throughout visit for lymphedema self care/ home program, including compression wrapping, compression garment and device wear/care, lymphatic pumping ther ex, simple self-MLD, and skin care. Discussed progress towards goals.    Person(s) Educated  Patient    Methods  Explanation;Demonstration;Handout;Tactile cues;Verbal cues    Comprehension  Verbalized understanding;Returned demonstration;Verbal cues required;Tactile cues required;Need further instruction          OT Long Term Goals - 07/12/19 1500      OT LONG TERM GOAL #1   Title  Pt will apply BLE, knee length, multi-layer, short stretch  compression wraps daily using correct gradient techniques with min caregiver assistance     to achieve optimal limb volume reduction, to return affected limb , as closely as possible, to premorbid size and shape, to limit infection risk, and to improve safe functional ambulation and mobility.    Status  Achieved      OT LONG TERM GOAL #2   Title  Pt to achieve at least 10% BLE limb volume reductions below the knees during Intensive Phase CDT to increase  standing / walking tolerance , to increase functional performance of basic and instrumental ADLs, and to limit LE progression.      OT LONG TERM GOAL #3   Status  --   Met for RLE with 34% limb volume reduction below the knee     OT LONG TERM GOAL #4   Title  Pt will achieve and sustain at least 85%  compliance with daily LE self-care home program (skin care, lymphatic pumping therex, compression and simple self-MLD) ) with min CG assistance during Intensive Phase CDT  to limit  limb swelling, reduce infection risk,  limit associated pain , and limit LE progression.    Status  Achieved      OT LONG TERM GOAL #5  Title  Once issued Pt will be able to don and doff appropriate compression garments and/ or devices using correct techniques and assistive devices with extra time (modified independence) by end of Intensive Phase CDT for optimal LE management to limit progression over time.    Status  Achieved      OT LONG TERM GOAL #6   Title  During self-management phase of CDT Pt will retain limb volume reductions achieved during Intensive Phase CDT with no more than 3% volume increase to limit LE progression and further functional decline.    Status  On-going            Plan - 07/22/19 1358    Clinical Impression Statement  LLE continues to respond positively to CDT with decreasnig limb volume and tissue density. Pt continues to perform all home program self care protocols diligently as directed. Cont as per POC.    OT Occupational Profile  and History  Comprehensive Assessment- Review of records and extensive additional review of physical, cognitive, psychosocial history related to current functional performance    Occupational performance deficits (Please refer to evaluation for details):  ADL's;Leisure;IADL's;Social Participation;Work;Other   parenting role, self esteme, body image   Body Structure / Function / Physical Skills  ADL;Obesity;Decreased knowledge of precautions;Balance;Decreased knowledge of use of DME;Flexibility;IADL;Pain;Skin integrity;Edema;Mobility;ROM;Gait    Rehab Potential  Good    Clinical Decision Making  Several treatment options, min-mod task modification necessary    Comorbidities Affecting Occupational Performance:  Presence of comorbidities impacting occupational performance    Comorbidities impacting occupational performance description:  See SUBJECTIVE    Modification or Assistance to Complete Evaluation   Min-Moderate modification of tasks or assist with assess necessary to complete eval    OT Frequency  2x / week    OT Duration  12 weeks   and PRN   OT Treatment/Interventions  Self-care/ADL training;Manual lymph drainage;Coping strategies training;Patient/family education;Compression bandaging;Therapist, nutritional;Therapeutic activities;Manual Therapy;DME and/or AE instruction;Other (comment)   myofacial release, skin care   Plan  treat one leg at a time to limit fall risk. Fit with custom compression garments bilaterally needed to achieve correct containment and fit. Consider fitting with 32-chamber, sequential pneumatic compression device (Tactile Medical Flexitouch "pump" ). The Flexitouch SPCD is the only sequential device that duplicates proximal to distal lymphatic decongestion to return interstitial fluid through inguinal and abdominal lymphatics to the heart. A basic pneumatic device is not appropriate for this patient because it does not treat deep abdominal lymphatics essential for  transporting lymphatic congestion to the thoracic duct to return to circulation.       Patient will benefit from skilled therapeutic intervention in order to improve the following deficits and impairments:   Body Structure / Function / Physical Skills: ADL, Obesity, Decreased knowledge of precautions, Balance, Decreased knowledge of use of DME, Flexibility, IADL, Pain, Skin integrity, Edema, Mobility, ROM, Gait       Visit Diagnosis: Lymphedema, not elsewhere classified    Problem List Patient Active Problem List   Diagnosis Date Noted  . 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 Anxiety state 11/28/2009  . Depressive disorder 11/28/2009  . Essential hypertension 11/28/2009    Andrey Spearman, MS, OTR/L, [facility] 07/22/19 2:03 PM  [place] MAIN [facility] SERVICES [address] [place], [place], [address] Phone: [phone]   Fax:  [phone]  Name: Forrestine Lecrone MRN: [id number] Date of Birth: 1956-03-20

## 2019-07-22 NOTE — Progress Notes (Signed)
This patient presents the office with chief complaint of a painful callus in the center of her right foot. .  She says this callus has become increasingly painful and she presents the office today for an evaluation and treatment of this callus.Patient was seen by myself five months ago and trimmed her callus which has now returned.  Patient gives a history of having been hospitalized for cellulitis in her right leg and foot.  She also has significant lymphedema both legs and feet.    General Appearance  Alert, conversant and in no acute stress.  Vascular  Dorsalis pedis and posterior tibial  pulses are weakly  palpable  Bilaterally despite swelling both feet..  Capillary return is within normal limits  bilaterally. Temperature is within normal limits  bilaterally.  Neurologic  Senn-Weinstein monofilament wire test within normal limits  bilaterally. Muscle power within normal limits bilaterally.  Nails Thick disfigured discolored nails with subungual debris  from hallux to fifth toes bilaterally. No evidence of bacterial infection or drainage bilaterally.  Orthopedic  No limitations of motion  feet .  No crepitus or effusions noted.  No bony pathology or digital deformities noted.  Pes planus  B/l.  Skin  normotropic skin with no porokeratosis noted bilaterally.  No signs of infections or ulcers noted.  Porokeratosis sub 4th MCJ right foot.  Porokeratosis right foot.  Debride porokeratosis  Right foot.  RTC 3 months.  Helane Gunther DPM

## 2019-07-26 ENCOUNTER — Ambulatory Visit: Payer: Self-pay | Admitting: Podiatry

## 2019-07-28 ENCOUNTER — Other Ambulatory Visit: Payer: Self-pay

## 2019-07-28 ENCOUNTER — Ambulatory Visit: Payer: Self-pay | Admitting: Occupational Therapy

## 2019-07-28 DIAGNOSIS — I890 Lymphedema, not elsewhere classified: Secondary | ICD-10-CM

## 2019-07-28 NOTE — Therapy (Signed)
[place] MAIN [facility] SERVICES [address] [place], [place], [address] Phone: [phone]   Fax:  [phone]  Occupational Therapy Treatment  Patient Details  Name: Jaclyn Kramer MRN: [id number] Date of Birth: 31-Jan-1956 Referring Provider (OT): Linton Ham, MD   Encounter Date: 07/28/2019  OT End of Session - 07/28/19 7517    Visit Number  39    Number of Visits  72    Date for OT Re-Evaluation  10/10/19    OT Start Time  1016    OT Stop Time  1125    OT Time Calculation (min)  69 min    Activity Tolerance  Patient tolerated treatment well;No increased pain    Behavior During Therapy  [facility] for tasks assessed/performed       No past medical history on file.  No past surgical history on file.  There were no vitals filed for this visit.  Subjective Assessment - 07/28/19 1640    Subjective   Jaclyn Kramer presents for OT visit 39/72 to address BLE lymphedema. Pt is 16 minutes late for a 60 minute session. Pt denies leg pain today. She presents with LLE compression wraps in place, and RLE custom compression knee high in place.                   OT Treatments/Exercises (OP) - 07/28/19 0001      ADLs   ADL Education Given  Yes      Manual Therapy   Manual Therapy  Edema management;Manual Lymphatic Drainage (MLD);Compression Bandaging    Edema Management  skin are with low PH cator oil to LLE      during MLD to increase skin hydration and flexibility    Manual Lymphatic Drainage (MLD)  MLD to LLE as established    Compression Bandaging  LLE MLD as established             OT Education - 07/28/19 1643    Education Details  Continued skilled Pt/caregiver education  And LE ADL training throughout visit for lymphedema self care/ home program, including compression wrapping, compression garment and device wear/care, lymphatic pumping ther ex, simple self-MLD, and skin care. Discussed progress towards goals.    Person(s)  Educated  Patient    Methods  Explanation;Demonstration    Comprehension  Verbalized understanding;Returned demonstration;Need further instruction          OT Long Term Goals - 07/12/19 1500      OT LONG TERM GOAL #1   Title  Pt will apply BLE, knee length, multi-layer, short stretch compression wraps daily using correct gradient techniques with min caregiver assistance     to achieve optimal limb volume reduction, to return affected limb , as closely as possible, to premorbid size and shape, to limit infection risk, and to improve safe functional ambulation and mobility.    Status  Achieved      OT LONG TERM GOAL #2   Title  Pt to achieve at least 10% BLE limb volume reductions below the knees during Intensive Phase CDT to increase  standing / walking tolerance , to increase functional performance of basic and instrumental ADLs, and to limit LE progression.      OT LONG TERM GOAL #3   Status  --   Met for RLE with 34% limb volume reduction below the knee     OT LONG TERM GOAL #4   Title  Pt will achieve and sustain at least 85%  compliance with daily LE self-care home program (skin care, lymphatic pumping therex, compression and simple self-MLD) ) with min CG assistance during Intensive Phase CDT  to limit  limb swelling, reduce infection risk,  limit associated pain , and limit LE progression.    Status  Achieved      OT LONG TERM GOAL #5   Title  Once issued Pt will be able to don and doff appropriate compression garments and/ or devices using correct techniques and assistive devices with extra time (modified independence) by end of Intensive Phase CDT for optimal LE management to limit progression over time.    Status  Achieved      OT LONG TERM GOAL #6   Title  During self-management phase of CDT Pt will retain limb volume reductions achieved during Intensive Phase CDT with no more than 3% volume increase to limit LE progression and further functional decline.    Status  On-going             Plan - 07/28/19 1645    Clinical Impression Statement  Pt continues to manage LE well  between visits. Pt had no difficulty tolerating MLD and gradient compression wrap to LLEtoday. Skin is palpably more dry than usual lately. Encouraged Pt to increase skin care after bathing and before reapplying wraps daily.    OT Occupational Profile and History  Comprehensive Assessment- Review of records and extensive additional review of physical, cognitive, psychosocial history related to current functional performance    Occupational performance deficits (Please refer to evaluation for details):  ADL's;Leisure;IADL's;Social Participation;Work;Other   parenting role, self esteme, body image   Body Structure / Function / Physical Skills  ADL;Obesity;Decreased knowledge of precautions;Balance;Decreased knowledge of use of DME;Flexibility;IADL;Pain;Skin integrity;Edema;Mobility;ROM;Gait    Rehab Potential  Good    Clinical Decision Making  Several treatment options, min-mod task modification necessary    Comorbidities Affecting Occupational Performance:  Presence of comorbidities impacting occupational performance    Comorbidities impacting occupational performance description:  See SUBJECTIVE    Modification or Assistance to Complete Evaluation   Min-Moderate modification of tasks or assist with assess necessary to complete eval    OT Frequency  2x / week    OT Duration  12 weeks   and PRN   OT Treatment/Interventions  Self-care/ADL training;Manual lymph drainage;Coping strategies training;Patient/family education;Compression bandaging;Therapist, nutritional;Therapeutic activities;Manual Therapy;DME and/or AE instruction;Other (comment)   myofacial release, skin care   Plan  treat one leg at a time to limit fall risk. Fit with custom compression garments bilaterally needed to achieve correct containment and fit. Consider fitting with 32-chamber, sequential pneumatic compression device  (Tactile Medical Flexitouch "pump" ). The Flexitouch SPCD is the only sequential device that duplicates proximal to distal lymphatic decongestion to return interstitial fluid through inguinal and abdominal lymphatics to the heart. A basic pneumatic device is not appropriate for this patient because it does not treat deep abdominal lymphatics essential for transporting lymphatic congestion to the thoracic duct to return to circulation.       Patient will benefit from skilled therapeutic intervention in order to improve the following deficits and impairments:   Body Structure / Function / Physical Skills: ADL, Obesity, Decreased knowledge of precautions, Balance, Decreased knowledge of use of DME, Flexibility, IADL, Pain, Skin integrity, Edema, Mobility, ROM, Gait       Visit Diagnosis: Lymphedema, not elsewhere classified    Problem List Patient Active Problem List   Diagnosis Date Noted  . 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 Anxiety  state 11/28/2009  . Depressive disorder 11/28/2009  . Essential hypertension 11/28/2009    Andrey Spearman, MS, OTR/L, [facility] 07/28/19 4:47 PM   [place] MAIN [facility] SERVICES [address] [place], [place], [address] Phone: [phone]   Fax:  [phone]  Name: Jaclyn Kramer MRN: [id number] Date of Birth: 24-Dec-1955

## 2019-07-30 ENCOUNTER — Ambulatory Visit: Payer: Self-pay | Admitting: Occupational Therapy

## 2019-07-30 ENCOUNTER — Other Ambulatory Visit: Payer: Self-pay

## 2019-07-30 DIAGNOSIS — I890 Lymphedema, not elsewhere classified: Secondary | ICD-10-CM

## 2019-07-30 NOTE — Therapy (Signed)
[place] MAIN [facility] SERVICES [address] [place], [place], [address] Phone: [phone]   Fax:  [phone]  Occupational Therapy Treatment Note and Progress Report:  Lymphedema Care  Patient Details  Name: Jaclyn Kramer MRN: [id number] Date of Birth: 22-Apr-1956 Referring Provider (OT): Linton Ham, MD   Encounter Date: 07/30/2019  OT End of Session - 07/30/19 1030    Visit Number  40    Number of Visits  72    Date for OT Re-Evaluation  10/10/19    OT Start Time  1021    OT Stop Time  1110    OT Time Calculation (min)  49 min    Activity Tolerance  Patient tolerated treatment well;No increased pain    Behavior During Therapy  [facility] for tasks assessed/performed       No past medical history on file.  No past surgical history on file.  There were no vitals filed for this visit.  Subjective Assessment - 07/30/19 1026    Subjective   [name] presents for OT visit 40/72 to address BLE lymphedema. Pt is 16 minutes late for a 60 minute session. Pt denies leg pain today. She presents with LLE compression wraps in place, and RLE custom compression knee high in place.                   OT Treatments/Exercises (OP) - 07/30/19 1030      ADLs   ADL Education Given  Yes      Manual Therapy   Manual Therapy  Edema management    Edema Management  skin are with low PH cator oil to LLE      during MLD to increase skin hydration and flexibility    Manual Lymphatic Drainage (MLD)  MLD to LLE as established    Compression Bandaging  LLE MLD as established             OT Education - 07/30/19 1030    Education Details  Continued skilled Pt/caregiver education  And LE ADL training throughout visit for lymphedema self care/ home program, including compression wrapping, compression garment and device wear/care, lymphatic pumping ther ex, simple self-MLD, and skin care. Discussed progress towards goals.    Person(s) Educated   Patient    Methods  Explanation;Demonstration    Comprehension  Verbalized understanding;Returned demonstration;Need further instruction          OT Long Term Goals - 07/30/19 1300      OT LONG TERM GOAL #1   Title  Pt will apply BLE, knee length, multi-layer, short stretch compression wraps daily using correct gradient techniques with min caregiver assistance     to achieve optimal limb volume reduction, to return affected limb , as closely as possible, to premorbid size and shape, to limit infection risk, and to improve safe functional ambulation and mobility.    Status  Achieved      OT LONG TERM GOAL #2   Title  Pt to achieve at least 10% BLE limb volume reductions below the knees during Intensive Phase CDT to increase  standing / walking tolerance , to increase functional performance of basic and instrumental ADLs, and to limit LE progression.    Status  Partially Met   Met for RLE; ongoing for LLE.     OT LONG TERM GOAL #3   Status  --   Met for RLE with 34% limb volume reduction below the knee     OT  LONG TERM GOAL #4   Title  Pt will achieve and sustain at least 85%  compliance with daily LE self-care home program (skin care, lymphatic pumping therex, compression and simple self-MLD) ) with min CG assistance during Intensive Phase CDT  to limit  limb swelling, reduce infection risk,  limit associated pain , and limit LE progression.    Status  Achieved      OT LONG TERM GOAL #5   Title  Once issued Pt will be able to don and doff appropriate compression garments and/ or devices using correct techniques and assistive devices with extra time (modified independence) by end of Intensive Phase CDT for optimal LE management to limit progression over time.    Status  Achieved      OT LONG TERM GOAL #6   Title  During self-management phase of CDT Pt will retain limb volume reductions achieved during Intensive Phase CDT with no more than 3% volume increase to limit LE progression and  further functional decline.    Status  On-going            Plan - 07/30/19 1300    Clinical Impression Statement  Pt demonstrates progress towards all OT goals for LE care. RLE is fitted with custom garment which is controlling limb swelling below the knee well. Pt remains diligent between visits with compression , ther ex, skin care and MLD. Pt reports she is using Flextouch device as directed on alternating legs. LLE continues to respond positively to CDT with decreasnig limb volume and tissue density. Pt continues to perform all home program self care protocols diligently as directed. Cont as per POC.    OT Occupational Profile and History  Comprehensive Assessment- Review of records and extensive additional review of physical, cognitive, psychosocial history related to current functional performance    Occupational performance deficits (Please refer to evaluation for details):  ADL's;Leisure;IADL's;Social Participation;Work;Other   parenting role, self esteme, body image   Body Structure / Function / Physical Skills  ADL;Obesity;Decreased knowledge of precautions;Balance;Decreased knowledge of use of DME;Flexibility;IADL;Pain;Skin integrity;Edema;Mobility;ROM;Gait    Rehab Potential  Good    Clinical Decision Making  Several treatment options, min-mod task modification necessary    Comorbidities Affecting Occupational Performance:  Presence of comorbidities impacting occupational performance    Comorbidities impacting occupational performance description:  See SUBJECTIVE    Modification or Assistance to Complete Evaluation   Min-Moderate modification of tasks or assist with assess necessary to complete eval    OT Frequency  2x / week    OT Duration  12 weeks   and PRN   OT Treatment/Interventions  Self-care/ADL training;Manual lymph drainage;Coping strategies training;Patient/family education;Compression bandaging;Therapist, nutritional;Therapeutic activities;Manual Therapy;DME  and/or AE instruction;Other (comment)   myofacial release, skin care   Plan  treat one leg at a time to limit fall risk. Fit with custom compression garments bilaterally needed to achieve correct containment and fit. Consider fitting with 32-chamber, sequential pneumatic compression device (Tactile Medical Flexitouch "pump" ). The Flexitouch SPCD is the only sequential device that duplicates proximal to distal lymphatic decongestion to return interstitial fluid through inguinal and abdominal lymphatics to the heart. A basic pneumatic device is not appropriate for this patient because it does not treat deep abdominal lymphatics essential for transporting lymphatic congestion to the thoracic duct to return to circulation.       Patient will benefit from skilled therapeutic intervention in order to improve the following deficits and impairments:   Body Structure / Function /  Physical Skills: ADL, Obesity, Decreased knowledge of precautions, Balance, Decreased knowledge of use of DME, Flexibility, IADL, Pain, Skin integrity, Edema, Mobility, ROM, Gait       Visit Diagnosis: Lymphedema, not elsewhere classified    Problem List Patient Active Problem List   Diagnosis Date Noted  . 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 Anxiety state 11/28/2009  . Depressive disorder 11/28/2009  . Essential hypertension 11/28/2009    Andrey Spearman, MS, OTR/L, [facility] 07/30/19 1:04 PM  [place] MAIN [facility] SERVICES [address] [place], [place], [address] Phone: [phone]   Fax:  [phone]  Name: Becca Bayne MRN: [id number] Date of Birth: [birth year]

## 2019-08-04 ENCOUNTER — Other Ambulatory Visit: Payer: Self-pay

## 2019-08-04 ENCOUNTER — Ambulatory Visit: Payer: Self-pay | Admitting: Occupational Therapy

## 2019-08-04 DIAGNOSIS — I890 Lymphedema, not elsewhere classified: Secondary | ICD-10-CM

## 2019-08-04 NOTE — Therapy (Signed)
[place] MAIN [facility] SERVICES [address] [place], [place], [address] Phone: [phone]   Fax:  [phone]  Occupational Therapy Treatment  Patient Details  Name: Jaclyn Kramer MRN: [id number] Date of Birth: August 10, 1955 Referring Provider (OT): Linton Ham, MD   Encounter Date: 08/04/2019  OT End of Session - 08/04/19 1111    Visit Number  41    Number of Visits  72    Date for OT Re-Evaluation  10/10/19    OT Start Time  1020    OT Stop Time  1115    OT Time Calculation (min)  55 min    Activity Tolerance  Patient tolerated treatment well;No increased pain    Behavior During Therapy  [facility] for tasks assessed/performed       No past medical history on file.  No past surgical history on file.  There were no vitals filed for this visit.  Subjective Assessment - 08/04/19 0956    Subjective   Jaclyn Kramer presents for OT visit 41/72 to address BLE lymphedema. Pt is 20 mins late for a 60 min session. Pt presents w RLE custom compression knee high in place , and with LLE gradient compression. Pt has no new complaints.                   OT Treatments/Exercises (OP) - 08/04/19 0001      ADLs   ADL Education Given  Yes      Manual Therapy   Manual Therapy  Edema management;Compression Bandaging    Edema Management  skin are with low PH cator oil to LLE      during MLD to increase skin hydration and flexibility    Manual Lymphatic Drainage (MLD)  MLD to LLE as established    Compression Bandaging  LLE multilayer gradient compression wrap from base of toes to tibial tuberosity using 8, 10 and 12" short stretch wraps over single layer of 0.4 cm Rosidal foam and cotton stockinett. Circumferential wrap only today             OT Education - 08/04/19 1110    Education Details  Continued skilled Pt/caregiver education  And LE ADL training throughout visit for lymphedema self care/ home program, including compression  wrapping, compression garment and device wear/care, lymphatic pumping ther ex, simple self-MLD, and skin care. Discussed progress towards goals.    Person(s) Educated  Patient    Methods  Explanation;Demonstration    Comprehension  Verbalized understanding;Returned demonstration;Need further instruction          OT Long Term Goals - 07/30/19 1300      OT LONG TERM GOAL #1   Title  Pt will apply BLE, knee length, multi-layer, short stretch compression wraps daily using correct gradient techniques with min caregiver assistance     to achieve optimal limb volume reduction, to return affected limb , as closely as possible, to premorbid size and shape, to limit infection risk, and to improve safe functional ambulation and mobility.    Status  Achieved      OT LONG TERM GOAL #2   Title  Pt to achieve at least 10% BLE limb volume reductions below the knees during Intensive Phase CDT to increase  standing / walking tolerance , to increase functional performance of basic and instrumental ADLs, and to limit LE progression.    Status  Partially Met   Met for RLE; ongoing for LLE.     OT LONG TERM  GOAL #3   Status  --   Met for RLE with 34% limb volume reduction below the knee     OT LONG TERM GOAL #4   Title  Pt will achieve and sustain at least 85%  compliance with daily LE self-care home program (skin care, lymphatic pumping therex, compression and simple self-MLD) ) with min CG assistance during Intensive Phase CDT  to limit  limb swelling, reduce infection risk,  limit associated pain , and limit LE progression.    Status  Achieved      OT LONG TERM GOAL #5   Title  Once issued Pt will be able to don and doff appropriate compression garments and/ or devices using correct techniques and assistive devices with extra time (modified independence) by end of Intensive Phase CDT for optimal LE management to limit progression over time.    Status  Achieved      OT LONG TERM GOAL #6   Title  During  self-management phase of CDT Pt will retain limb volume reductions achieved during Intensive Phase CDT with no more than 3% volume increase to limit LE progression and further functional decline.    Status  On-going            Plan - 08/04/19 1111    Clinical Impression Statement  Pt continues to manage LE well  between visits. Pt had no difficulty tolerating MLD and gradient compression wrap to LLEtoday. Skin is palpably more dry than usual lately. Encouraged Pt to increase skin care after bathing and before reapplying wraps daily.    OT Occupational Profile and History  Comprehensive Assessment- Review of records and extensive additional review of physical, cognitive, psychosocial history related to current functional performance    Occupational performance deficits (Please refer to evaluation for details):  ADL's;Leisure;IADL's;Social Participation;Work;Other   parenting role, self esteme, body image   Body Structure / Function / Physical Skills  ADL;Obesity;Decreased knowledge of precautions;Balance;Decreased knowledge of use of DME;Flexibility;IADL;Pain;Skin integrity;Edema;Mobility;ROM;Gait    Rehab Potential  Good    Clinical Decision Making  Several treatment options, min-mod task modification necessary    Comorbidities Affecting Occupational Performance:  Presence of comorbidities impacting occupational performance    Comorbidities impacting occupational performance description:  See SUBJECTIVE    Modification or Assistance to Complete Evaluation   Min-Moderate modification of tasks or assist with assess necessary to complete eval    OT Frequency  2x / week    OT Duration  12 weeks   and PRN   OT Treatment/Interventions  Self-care/ADL training;Manual lymph drainage;Coping strategies training;Patient/family education;Compression bandaging;Therapist, nutritional;Therapeutic activities;Manual Therapy;DME and/or AE instruction;Other (comment)   myofacial release, skin care   Plan   treat one leg at a time to limit fall risk. Fit with custom compression garments bilaterally needed to achieve correct containment and fit. Consider fitting with 32-chamber, sequential pneumatic compression device (Tactile Medical Flexitouch "pump" ). The Flexitouch SPCD is the only sequential device that duplicates proximal to distal lymphatic decongestion to return interstitial fluid through inguinal and abdominal lymphatics to the heart. A basic pneumatic device is not appropriate for this patient because it does not treat deep abdominal lymphatics essential for transporting lymphatic congestion to the thoracic duct to return to circulation.       Patient will benefit from skilled therapeutic intervention in order to improve the following deficits and impairments:   Body Structure / Function / Physical Skills: ADL, Obesity, Decreased knowledge of precautions, Balance, Decreased knowledge of use of DME,  Flexibility, IADL, Pain, Skin integrity, Edema, Mobility, ROM, Gait       Visit Diagnosis: Lymphedema, not elsewhere classified    Problem List Patient Active Problem List   Diagnosis Date Noted  . 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 Anxiety state 11/28/2009  . Depressive disorder 11/28/2009  . Essential hypertension 11/28/2009   Andrey Spearman, MS, OTR/L, [facility] 08/04/19 12:51 PM   [place] MAIN [facility] SERVICES [address] [place], [place], [address] Phone: [phone]   Fax:  [phone]  Name: Takina Busser MRN: [id number] Date of Birth: 1955/07/09

## 2019-08-06 ENCOUNTER — Ambulatory Visit: Payer: Self-pay | Admitting: Occupational Therapy

## 2019-08-06 ENCOUNTER — Other Ambulatory Visit: Payer: Self-pay

## 2019-08-06 DIAGNOSIS — I890 Lymphedema, not elsewhere classified: Secondary | ICD-10-CM

## 2019-08-06 NOTE — Therapy (Signed)
[place] MAIN [facility] SERVICES [address] [place], [place], [address] Phone: [phone]   Fax:  [phone]  Occupational Therapy Treatment  Patient Details  Name: Jaclyn Kramer MRN: [id number] Date of Birth: 1955/09/25 Referring Provider (OT): Linton Ham, MD   Encounter Date: 08/06/2019  OT End of Session - 08/06/19 1032    Visit Number  42    Number of Visits  72    Date for OT Re-Evaluation  10/10/19    OT Start Time  1024    OT Stop Time  1108    OT Time Calculation (min)  44 min    Activity Tolerance  Patient tolerated treatment well;No increased pain    Behavior During Therapy  [facility] for tasks assessed/performed       No past medical history on file.  No past surgical history on file.  There were no vitals filed for this visit.  Subjective Assessment - 08/06/19 1030    Subjective   Jaclyn Kramer presents for OT visit 42/72 to address BLE lymphedema. Pt is 24 mins late for a 60 min session. Pt presents w RLE custom compression knee high in place , and with LLE gradient compression. Pt has no new complaints.    Currently in Pain?  No/denies                   OT Treatments/Exercises (OP) - 08/06/19 0001      ADLs   ADL Education Given  Yes      Manual Therapy   Manual Therapy  Edema management    Edema Management  skin are with low PH cator oil to LLE      during MLD to increase skin hydration and flexibility    Manual Lymphatic Drainage (MLD)  MLD to LLE as established    Compression Bandaging  LLE multilayer gradient compression wrap from base of toes to tibial tuberosity using 8, 10 and 12" short stretch wraps over single layer of 0.4 cm Rosidal foam and cotton stockinett. Circumferential wrap only today             OT Education - 08/06/19 1032    Education Details  Continued skilled Pt/caregiver education  And LE ADL training throughout visit for lymphedema self care/ home program, including  compression wrapping, compression garment and device wear/care, lymphatic pumping ther ex, simple self-MLD, and skin care. Discussed progress towards goals.    Person(s) Educated  Patient    Methods  Explanation;Demonstration    Comprehension  Verbalized understanding;Returned demonstration;Need further instruction          OT Long Term Goals - 07/30/19 1300      OT LONG TERM GOAL #1   Title  Pt will apply BLE, knee length, multi-layer, short stretch compression wraps daily using correct gradient techniques with min caregiver assistance     to achieve optimal limb volume reduction, to return affected limb , as closely as possible, to premorbid size and shape, to limit infection risk, and to improve safe functional ambulation and mobility.    Status  Achieved      OT LONG TERM GOAL #2   Title  Pt to achieve at least 10% BLE limb volume reductions below the knees during Intensive Phase CDT to increase  standing / walking tolerance , to increase functional performance of basic and instrumental ADLs, and to limit LE progression.    Status  Partially Met   Met for RLE; ongoing for LLE.  OT LONG TERM GOAL #3   Status  --   Met for RLE with 34% limb volume reduction below the knee     OT LONG TERM GOAL #4   Title  Pt will achieve and sustain at least 85%  compliance with daily LE self-care home program (skin care, lymphatic pumping therex, compression and simple self-MLD) ) with min CG assistance during Intensive Phase CDT  to limit  limb swelling, reduce infection risk,  limit associated pain , and limit LE progression.    Status  Achieved      OT LONG TERM GOAL #5   Title  Once issued Pt will be able to don and doff appropriate compression garments and/ or devices using correct techniques and assistive devices with extra time (modified independence) by end of Intensive Phase CDT for optimal LE management to limit progression over time.    Status  Achieved      OT LONG TERM GOAL #6    Title  During self-management phase of CDT Pt will retain limb volume reductions achieved during Intensive Phase CDT with no more than 3% volume increase to limit LE progression and further functional decline.    Status  On-going            Plan - 08/06/19 1103    Clinical Impression Statement  LLE continues to respond positively to CDT with decreasnig limb volume and tissue density. Pt continues to perform all home program self care protocols diligently as directed. Cont as per POC.    OT Occupational Profile and History  Comprehensive Assessment- Review of records and extensive additional review of physical, cognitive, psychosocial history related to current functional performance    Occupational performance deficits (Please refer to evaluation for details):  ADL's;Leisure;IADL's;Social Participation;Work;Other   parenting role, self esteme, body image   Body Structure / Function / Physical Skills  ADL;Obesity;Decreased knowledge of precautions;Balance;Decreased knowledge of use of DME;Flexibility;IADL;Pain;Skin integrity;Edema;Mobility;ROM;Gait    Rehab Potential  Good    Clinical Decision Making  Several treatment options, min-mod task modification necessary    Comorbidities Affecting Occupational Performance:  Presence of comorbidities impacting occupational performance    Comorbidities impacting occupational performance description:  See SUBJECTIVE    Modification or Assistance to Complete Evaluation   Min-Moderate modification of tasks or assist with assess necessary to complete eval    OT Frequency  2x / week    OT Duration  12 weeks   and PRN   OT Treatment/Interventions  Self-care/ADL training;Manual lymph drainage;Coping strategies training;Patient/family education;Compression bandaging;Therapist, nutritional;Therapeutic activities;Manual Therapy;DME and/or AE instruction;Other (comment)   myofacial release, skin care   Plan  treat one leg at a time to limit fall risk. Fit  with custom compression garments bilaterally needed to achieve correct containment and fit. Consider fitting with 32-chamber, sequential pneumatic compression device (Tactile Medical Flexitouch "pump" ). The Flexitouch SPCD is the only sequential device that duplicates proximal to distal lymphatic decongestion to return interstitial fluid through inguinal and abdominal lymphatics to the heart. A basic pneumatic device is not appropriate for this patient because it does not treat deep abdominal lymphatics essential for transporting lymphatic congestion to the thoracic duct to return to circulation.       Patient will benefit from skilled therapeutic intervention in order to improve the following deficits and impairments:   Body Structure / Function / Physical Skills: ADL, Obesity, Decreased knowledge of precautions, Balance, Decreased knowledge of use of DME, Flexibility, IADL, Pain, Skin integrity, Edema, Mobility, ROM,  Gait       Visit Diagnosis: Lymphedema, not elsewhere classified    Problem List Patient Active Problem List   Diagnosis Date Noted  . 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 Anxiety state 11/28/2009  . Depressive disorder 11/28/2009  . Essential hypertension 11/28/2009   Andrey Spearman, MS, OTR/L, [facility] 08/06/19 11:04 AM -  [place] MAIN [facility] SERVICES [address] [place], [place], [address] Phone: [phone]   Fax:  [phone]  Name: Liliani Bobo MRN: [id number] Date of Birth: September 06, 1955

## 2019-08-09 ENCOUNTER — Other Ambulatory Visit: Payer: Self-pay

## 2019-08-09 ENCOUNTER — Ambulatory Visit: Payer: Self-pay | Attending: Internal Medicine | Admitting: Occupational Therapy

## 2019-08-09 DIAGNOSIS — I890 Lymphedema, not elsewhere classified: Secondary | ICD-10-CM | POA: Insufficient documentation

## 2019-08-09 NOTE — Therapy (Signed)
[place] MAIN [facility] SERVICES [address] [place], [place], [address] Phone: [phone]   Fax:  [phone]  Occupational Therapy Treatment  Patient Details  Name: Jaclyn Kramer MRN: [id number] Date of Birth: 1956-04-03 Referring Provider (OT): Linton Ham, MD   Encounter Date: 08/09/2019  OT End of Session - 08/09/19 1038    Visit Number  43    Number of Visits  72    Date for OT Re-Evaluation  10/10/19    OT Start Time  1037    OT Stop Time  1115    OT Time Calculation (min)  38 min    Activity Tolerance  Patient tolerated treatment well;No increased pain    Behavior During Therapy  [facility] for tasks assessed/performed       No past medical history on file.  No past surgical history on file.  There were no vitals filed for this visit.  Subjective Assessment - 08/09/19 1012    Subjective   Jaclyn Kramer presents for OT visit 43/72 to address BLE lymphedema. Pt is 37 mins late for a 60 min session. Pt presents w RLE custom compression knee high in place , and with LLE gradient compression. Pt has no new complaints.    Currently in Pain?  No/denies                           OT Education - 08/09/19 1115    Education Details  Continued skilled Pt/caregiver education  And LE ADL training throughout visit for lymphedema self care/ home program, including compression wrapping, compression garment and device wear/care, lymphatic pumping ther ex, simple self-MLD, and skin care. Discussed progress towards goals.    Person(s) Educated  Patient    Methods  Explanation;Demonstration    Comprehension  Verbalized understanding;Returned demonstration;Need further instruction          OT Long Term Goals - 07/30/19 1300      OT LONG TERM GOAL #1   Title  Pt will apply BLE, knee length, multi-layer, short stretch compression wraps daily using correct gradient techniques with min caregiver assistance     to achieve optimal limb  volume reduction, to return affected limb , as closely as possible, to premorbid size and shape, to limit infection risk, and to improve safe functional ambulation and mobility.    Status  Achieved      OT LONG TERM GOAL #2   Title  Pt to achieve at least 10% BLE limb volume reductions below the knees during Intensive Phase CDT to increase  standing / walking tolerance , to increase functional performance of basic and instrumental ADLs, and to limit LE progression.    Status  Partially Met   Met for RLE; ongoing for LLE.     OT LONG TERM GOAL #3   Status  --   Met for RLE with 34% limb volume reduction below the knee     OT LONG TERM GOAL #4   Title  Pt will achieve and sustain at least 85%  compliance with daily LE self-care home program (skin care, lymphatic pumping therex, compression and simple self-MLD) ) with min CG assistance during Intensive Phase CDT  to limit  limb swelling, reduce infection risk,  limit associated pain , and limit LE progression.    Status  Achieved      OT LONG TERM GOAL #5   Title  Once issued Pt will be able to  don and doff appropriate compression garments and/ or devices using correct techniques and assistive devices with extra time (modified independence) by end of Intensive Phase CDT for optimal LE management to limit progression over time.    Status  Achieved      OT LONG TERM GOAL #6   Title  During self-management phase of CDT Pt will retain limb volume reductions achieved during Intensive Phase CDT with no more than 3% volume increase to limit LE progression and further functional decline.    Status  On-going            Plan - 08/09/19 1117    Clinical Impression Statement  Due to late arrival had limited time for CDT today, so focused on LLE skin care below the knee and reapplying clean compression wraps. Pt in agreement with plan to complete LLE custom compression stocklingt measurements and volumetrics next session. Cont as per POC.    OT  Occupational Profile and History  Comprehensive Assessment- Review of records and extensive additional review of physical, cognitive, psychosocial history related to current functional performance    Occupational performance deficits (Please refer to evaluation for details):  ADL's;Leisure;IADL's;Social Participation;Work;Other   parenting role, self esteme, body image   Body Structure / Function / Physical Skills  ADL;Obesity;Decreased knowledge of precautions;Balance;Decreased knowledge of use of DME;Flexibility;IADL;Pain;Skin integrity;Edema;Mobility;ROM;Gait    Rehab Potential  Good    Clinical Decision Making  Several treatment options, min-mod task modification necessary    Comorbidities Affecting Occupational Performance:  Presence of comorbidities impacting occupational performance    Comorbidities impacting occupational performance description:  See SUBJECTIVE    Modification or Assistance to Complete Evaluation   Min-Moderate modification of tasks or assist with assess necessary to complete eval    OT Frequency  2x / week    OT Duration  12 weeks   and PRN   OT Treatment/Interventions  Self-care/ADL training;Manual lymph drainage;Coping strategies training;Patient/family education;Compression bandaging;Therapist, nutritional;Therapeutic activities;Manual Therapy;DME and/or AE instruction;Other (comment)   myofacial release, skin care   Plan  treat one leg at a time to limit fall risk. Fit with custom compression garments bilaterally needed to achieve correct containment and fit. Consider fitting with 32-chamber, sequential pneumatic compression device (Tactile Medical Flexitouch "pump" ). The Flexitouch SPCD is the only sequential device that duplicates proximal to distal lymphatic decongestion to return interstitial fluid through inguinal and abdominal lymphatics to the heart. A basic pneumatic device is not appropriate for this patient because it does not treat deep abdominal  lymphatics essential for transporting lymphatic congestion to the thoracic duct to return to circulation.       Patient will benefit from skilled therapeutic intervention in order to improve the following deficits and impairments:   Body Structure / Function / Physical Skills: ADL, Obesity, Decreased knowledge of precautions, Balance, Decreased knowledge of use of DME, Flexibility, IADL, Pain, Skin integrity, Edema, Mobility, ROM, Gait       Visit Diagnosis: Lymphedema, not elsewhere classified    Problem List Patient Active Problem List   Diagnosis Date Noted  . 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 Anxiety state 11/28/2009  . Depressive disorder 11/28/2009  . Essential hypertension 11/28/2009    Andrey Spearman, MS, OTR/L, [facility] 08/09/19 11:21 AM  [place] MAIN [facility] SERVICES [address] [place], [place], [address] Phone: [phone]   Fax:  [phone]  Name: Jaclyn Kramer MRN: [id number] Date of Birth: [birth year]

## 2019-08-11 ENCOUNTER — Ambulatory Visit: Payer: Self-pay | Admitting: Occupational Therapy

## 2019-08-11 ENCOUNTER — Other Ambulatory Visit: Payer: Self-pay

## 2019-08-11 DIAGNOSIS — I890 Lymphedema, not elsewhere classified: Secondary | ICD-10-CM

## 2019-08-11 NOTE — Therapy (Signed)
[place] MAIN [facility] SERVICES [address] [place], [place], [address] Phone: [phone]   Fax:  [phone]  Occupational Therapy Treatment  Patient Details  Name: Jaclyn Kramer MRN: [id number] Date of Birth: [birth year] Referring Provider (OT): Linton Ham, MD   Encounter Date: 08/11/2019  OT End of Session - 08/11/19 1200    Visit Number  44    Number of Visits  72    Date for OT Re-Evaluation  10/10/19    OT Start Time  1020    OT Stop Time  1110    OT Time Calculation (min)  50 min    Activity Tolerance  Patient tolerated treatment well;No increased pain    Behavior During Therapy  [facility] for tasks assessed/performed       No past medical history on file.  No past surgical history on file.  There were no vitals filed for this visit.  Subjective Assessment - 08/11/19 1200    Subjective   Jaclyn Kramer presents for OT visit 44/72 to address BLE lymphedema. Pt is 20  mins late for a 60 min session. Pt presents w RLE custom compression knee high in place , and with LLE gradient compression. Pt has no new complaints.                                OT Long Term Goals - 07/30/19 1300      OT LONG TERM GOAL #1   Title  Pt will apply BLE, knee length, multi-layer, short stretch compression wraps daily using correct gradient techniques with min caregiver assistance     to achieve optimal limb volume reduction, to return affected limb , as closely as possible, to premorbid size and shape, to limit infection risk, and to improve safe functional ambulation and mobility.    Status  Achieved      OT LONG TERM GOAL #2   Title  Pt to achieve at least 10% BLE limb volume reductions below the knees during Intensive Phase CDT to increase  standing / walking tolerance , to increase functional performance of basic and instrumental ADLs, and to limit LE progression.    Status  Partially Met   Met for RLE; ongoing for LLE.     OT LONG TERM GOAL #3   Status  --   Met for RLE with 34% limb volume reduction below the knee     OT LONG TERM GOAL #4   Title  Pt will achieve and sustain at least 85%  compliance with daily LE self-care home program (skin care, lymphatic pumping therex, compression and simple self-MLD) ) with min CG assistance during Intensive Phase CDT  to limit  limb swelling, reduce infection risk,  limit associated pain , and limit LE progression.    Status  Achieved      OT LONG TERM GOAL #5   Title  Once issued Pt will be able to don and doff appropriate compression garments and/ or devices using correct techniques and assistive devices with extra time (modified independence) by end of Intensive Phase CDT for optimal LE management to limit progression over time.    Status  Achieved      OT LONG TERM GOAL #6   Title  During self-management phase of CDT Pt will retain limb volume reductions achieved during Intensive Phase CDT with no more than 3% volume increase to limit LE progression and further  functional decline.    Status  On-going            Plan - 08/11/19 1201    Clinical Impression Statement  Completed LLE comparative limb volumetrics to measure progress towards limb volume reduction goal.  LLE volume from ankle to tibial tuberosity is decreased by 7.8% since initially measuredon 03/17/19, whick  demonstrates excellent progress towards goal. Completed anatomical measurement for LLE custom compression garment and applied gradient wrap to LLE. Good session. Cont as per POC.    OT Occupational Profile and History  Comprehensive Assessment- Review of records and extensive additional review of physical, cognitive, psychosocial history related to current functional performance    Occupational performance deficits (Please refer to evaluation for details):  ADL's;Leisure;IADL's;Social Participation;Work;Other   parenting role, self esteme, body image   Body Structure / Function / Physical Skills   ADL;Obesity;Decreased knowledge of precautions;Balance;Decreased knowledge of use of DME;Flexibility;IADL;Pain;Skin integrity;Edema;Mobility;ROM;Gait    Rehab Potential  Good    Clinical Decision Making  Several treatment options, min-mod task modification necessary    Comorbidities Affecting Occupational Performance:  Presence of comorbidities impacting occupational performance    Comorbidities impacting occupational performance description:  See SUBJECTIVE    Modification or Assistance to Complete Evaluation   Min-Moderate modification of tasks or assist with assess necessary to complete eval    OT Frequency  2x / week    OT Duration  12 weeks   and PRN   OT Treatment/Interventions  Self-care/ADL training;Manual lymph drainage;Coping strategies training;Patient/family education;Compression bandaging;Therapist, nutritional;Therapeutic activities;Manual Therapy;DME and/or AE instruction;Other (comment)   myofacial release, skin care   Plan  treat one leg at a time to limit fall risk. Fit with custom compression garments bilaterally needed to achieve correct containment and fit. Consider fitting with 32-chamber, sequential pneumatic compression device (Tactile Medical Flexitouch "pump" ). The Flexitouch SPCD is the only sequential device that duplicates proximal to distal lymphatic decongestion to return interstitial fluid through inguinal and abdominal lymphatics to the heart. A basic pneumatic device is not appropriate for this patient because it does not treat deep abdominal lymphatics essential for transporting lymphatic congestion to the thoracic duct to return to circulation.       Patient will benefit from skilled therapeutic intervention in order to improve the following deficits and impairments:   Body Structure / Function / Physical Skills: ADL, Obesity, Decreased knowledge of precautions, Balance, Decreased knowledge of use of DME, Flexibility, IADL, Pain, Skin integrity, Edema,  Mobility, ROM, Gait       Visit Diagnosis: Lymphedema, not elsewhere classified    Problem List Patient Active Problem List   Diagnosis Date Noted  . 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 Anxiety state 11/28/2009  . Depressive disorder 11/28/2009  . Essential hypertension 11/28/2009    Andrey Spearman, MS, OTR/L, [facility] 08/11/19 12:05 PM  [place] MAIN [facility] SERVICES [address] [place], [place], [address] Phone: [phone]   Fax:  [phone]  Name: Jaclyn Kramer MRN: [id number] Date of Birth: 07-Nov-1955

## 2019-08-17 ENCOUNTER — Other Ambulatory Visit: Payer: Self-pay

## 2019-08-17 ENCOUNTER — Ambulatory Visit: Payer: Self-pay | Admitting: Occupational Therapy

## 2019-08-17 DIAGNOSIS — I890 Lymphedema, not elsewhere classified: Secondary | ICD-10-CM

## 2019-08-17 NOTE — Therapy (Signed)
[place] MAIN [facility] SERVICES [address] [place], [place], [address] Phone: [phone]   Fax:  [phone]  Occupational Therapy Treatment  Patient Details  Name: Jaclyn Kramer MRN: [id number] Date of Birth: Aug 22, 1955 Referring Provider (OT): Linton Ham, MD   Encounter Date: 08/17/2019  OT End of Session - 08/17/19 1136    Visit Number  45    Number of Visits  72    Date for OT Re-Evaluation  10/10/19    OT Start Time  1017    OT Stop Time  1125    OT Time Calculation (min)  68 min    Activity Tolerance  Patient tolerated treatment well;No increased pain    Behavior During Therapy  [facility] for tasks assessed/performed       No past medical history on file.  No past surgical history on file.  There were no vitals filed for this visit.  Subjective Assessment - 08/17/19 1133    Subjective   [name] presents for OT visit 45/72 to address BLE lymphedema. Pt is 17  mins late for a 60 min session. Pt presents w RLE custom compression knee high in place , and with LLE gradient compression. Pt has no new complaints.    Currently in Pain?  No/denies                   OT Treatments/Exercises (OP) - 08/17/19 0001      Manual Therapy   Manual Therapy  Edema management    Edema Management  skin are with low PH cator oil to LLE      during MLD to increase skin hydration and flexibility    Manual Lymphatic Drainage (MLD)  MLD to LLE as established    Compression Bandaging  RLE multilayer gradient compression wrap from base of toes to tibial tuberosity using 8, 10 and 12" short stretch wraps over single layer of 0.4 cm Rosidal foam and cotton stockinett. Circumferential wrap only today             OT Education - 08/17/19 1136    Education Details  Continued skilled Pt/caregiver education  And LE ADL training throughout visit for lymphedema self care/ home program, including compression wrapping, compression garment and  device wear/care, lymphatic pumping ther ex, simple self-MLD, and skin care. Discussed progress towards goals.    Person(s) Educated  Patient    Methods  Explanation;Demonstration    Comprehension  Verbalized understanding;Returned demonstration;Need further instruction          OT Long Term Goals - 07/30/19 1300      OT LONG TERM GOAL #1   Title  Pt will apply BLE, knee length, multi-layer, short stretch compression wraps daily using correct gradient techniques with min caregiver assistance     to achieve optimal limb volume reduction, to return affected limb , as closely as possible, to premorbid size and shape, to limit infection risk, and to improve safe functional ambulation and mobility.    Status  Achieved      OT LONG TERM GOAL #2   Title  Pt to achieve at least 10% BLE limb volume reductions below the knees during Intensive Phase CDT to increase  standing / walking tolerance , to increase functional performance of basic and instrumental ADLs, and to limit LE progression.    Status  Partially Met   Met for RLE; ongoing for LLE.     OT LONG TERM GOAL #3   Status  --  Met for RLE with 34% limb volume reduction below the knee     OT LONG TERM GOAL #4   Title  Pt will achieve and sustain at least 85%  compliance with daily LE self-care home program (skin care, lymphatic pumping therex, compression and simple self-MLD) ) with min CG assistance during Intensive Phase CDT  to limit  limb swelling, reduce infection risk,  limit associated pain , and limit LE progression.    Status  Achieved      OT LONG TERM GOAL #5   Title  Once issued Pt will be able to don and doff appropriate compression garments and/ or devices using correct techniques and assistive devices with extra time (modified independence) by end of Intensive Phase CDT for optimal LE management to limit progression over time.    Status  Achieved      OT LONG TERM GOAL #6   Title  During self-management phase of CDT Pt  will retain limb volume reductions achieved during Intensive Phase CDT with no more than 3% volume increase to limit LE progression and further functional decline.    Status  On-going            Plan - 08/17/19 1143    Clinical Impression Statement  Pt tolerated LLE MLD and skin care without difficulty today. Skin continues to be dry despite skin care at home and in the clinic frequently throughout the week. LLE appears well reduced and ready for compression garment. Pt in agreement with plan to complete LLE custom garment measurements next session later this week. Cont as per POC.    OT Occupational Profile and History  Comprehensive Assessment- Review of records and extensive additional review of physical, cognitive, psychosocial history related to current functional performance    Occupational performance deficits (Please refer to evaluation for details):  ADL's;Leisure;IADL's;Social Participation;Work;Other   parenting role, self esteme, body image   Body Structure / Function / Physical Skills  ADL;Obesity;Decreased knowledge of precautions;Balance;Decreased knowledge of use of DME;Flexibility;IADL;Pain;Skin integrity;Edema;Mobility;ROM;Gait    Rehab Potential  Good    Clinical Decision Making  Several treatment options, min-mod task modification necessary    Comorbidities Affecting Occupational Performance:  Presence of comorbidities impacting occupational performance    Comorbidities impacting occupational performance description:  See SUBJECTIVE    Modification or Assistance to Complete Evaluation   Min-Moderate modification of tasks or assist with assess necessary to complete eval    OT Frequency  2x / week    OT Duration  12 weeks   and PRN   OT Treatment/Interventions  Self-care/ADL training;Manual lymph drainage;Coping strategies training;Patient/family education;Compression bandaging;Therapist, nutritional;Therapeutic activities;Manual Therapy;DME and/or AE instruction;Other  (comment)   myofacial release, skin care   Plan  treat one leg at a time to limit fall risk. Fit with custom compression garments bilaterally needed to achieve correct containment and fit. Consider fitting with 32-chamber, sequential pneumatic compression device (Tactile Medical Flexitouch "pump" ). The Flexitouch SPCD is the only sequential device that duplicates proximal to distal lymphatic decongestion to return interstitial fluid through inguinal and abdominal lymphatics to the heart. A basic pneumatic device is not appropriate for this patient because it does not treat deep abdominal lymphatics essential for transporting lymphatic congestion to the thoracic duct to return to circulation.       Patient will benefit from skilled therapeutic intervention in order to improve the following deficits and impairments:   Body Structure / Function / Physical Skills: ADL, Obesity, Decreased knowledge of precautions, Balance,  Decreased knowledge of use of DME, Flexibility, IADL, Pain, Skin integrity, Edema, Mobility, ROM, Gait       Visit Diagnosis: Lymphedema, not elsewhere classified    Problem List Patient Active Problem List   Diagnosis Date Noted  . 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 Anxiety state 11/28/2009  . Depressive disorder 11/28/2009  . Essential hypertension 11/28/2009    Andrey Spearman, MS, OTR/L, [facility] 08/17/19 11:47 AM  [place] MAIN [facility] SERVICES [address] [place], [place], [address] Phone: [phone]   Fax:  [phone]  Name: Rudolph Dobler MRN: [id number] Date of Birth: 1956/01/17

## 2019-08-19 ENCOUNTER — Other Ambulatory Visit: Payer: Self-pay

## 2019-08-19 ENCOUNTER — Ambulatory Visit: Payer: Self-pay | Admitting: Occupational Therapy

## 2019-08-19 DIAGNOSIS — I890 Lymphedema, not elsewhere classified: Secondary | ICD-10-CM

## 2019-08-19 NOTE — Therapy (Signed)
[place] MAIN [facility] SERVICES [address] [place], [place], [address] Phone: [phone]   Fax:  [phone]  Occupational Therapy Treatment  Patient Details  Name: Jaclyn Kramer MRN: [id number] Date of Birth: 1956-01-19 Referring Provider (OT): Linton Ham, MD   Encounter Date: 08/19/2019  OT End of Session - 08/19/19 1103    Visit Number  46    Number of Visits  72    Date for OT Re-Evaluation  10/10/19    OT Start Time  1023    OT Stop Time  1108    OT Time Calculation (min)  45 min    Activity Tolerance  Patient tolerated treatment well;No increased pain    Behavior During Therapy  [facility] for tasks assessed/performed       No past medical history on file.  No past surgical history on file.  There were no vitals filed for this visit.  Subjective Assessment - 08/19/19 1024    Subjective   Jaclyn Kramer presents for OT visit 46/72 to address BLE lymphedema. Pt is 23  mins late for a 60 min session. Pt presents w RLE custom compression knee high in place , and with LLE gradient compression. Pt reporting 8/10 pain in her R hip, which started "the other day."                   OT Treatments/Exercises (OP) - 08/19/19 0001      ADLs   ADL Education Given  Yes      Manual Therapy   Manual Therapy  Edema management    Edema Management  skin are with low PH cator oil to LLE      during MLD to increase skin hydration and flexibility    Manual Lymphatic Drainage (MLD)  MLD to LLE as established    Compression Bandaging  RLE multilayer gradient compression wrap from base of toes to tibial tuberosity using 8, 10 and 12" short stretch wraps over single layer of 0.4 cm Rosidal foam and cotton stockinett. Circumferential wrap only today             OT Education - 08/19/19 1103    Education Details  Continued skilled Pt/caregiver education  And LE ADL training throughout visit for lymphedema self care/ home program, including  compression wrapping, compression garment and device wear/care, lymphatic pumping ther ex, simple self-MLD, and skin care. Discussed progress towards goals.    Person(s) Educated  Patient    Methods  Explanation;Demonstration    Comprehension  Verbalized understanding;Returned demonstration;Need further instruction          OT Long Term Goals - 07/30/19 1300      OT LONG TERM GOAL #1   Title  Pt will apply BLE, knee length, multi-layer, short stretch compression wraps daily using correct gradient techniques with min caregiver assistance     to achieve optimal limb volume reduction, to return affected limb , as closely as possible, to premorbid size and shape, to limit infection risk, and to improve safe functional ambulation and mobility.    Status  Achieved      OT LONG TERM GOAL #2   Title  Pt to achieve at least 10% BLE limb volume reductions below the knees during Intensive Phase CDT to increase  standing / walking tolerance , to increase functional performance of basic and instrumental ADLs, and to limit LE progression.    Status  Partially Met   Met for RLE; ongoing for  LLE.     OT LONG TERM GOAL #3   Status  --   Met for RLE with 34% limb volume reduction below the knee     OT LONG TERM GOAL #4   Title  Pt will achieve and sustain at least 85%  compliance with daily LE self-care home program (skin care, lymphatic pumping therex, compression and simple self-MLD) ) with min CG assistance during Intensive Phase CDT  to limit  limb swelling, reduce infection risk,  limit associated pain , and limit LE progression.    Status  Achieved      OT LONG TERM GOAL #5   Title  Once issued Pt will be able to don and doff appropriate compression garments and/ or devices using correct techniques and assistive devices with extra time (modified independence) by end of Intensive Phase CDT for optimal LE management to limit progression over time.    Status  Achieved      OT LONG TERM GOAL #6    Title  During self-management phase of CDT Pt will retain limb volume reductions achieved during Intensive Phase CDT with no more than 3% volume increase to limit LE progression and further functional decline.    Status  On-going            Plan - 08/19/19 1105    Clinical Impression Statement  LLE continues to respond positively to CDT with decreasnig limb volume and tissue density. Pt continues to perform all home program self care protocols diligently as directed. Cont as per POC.    OT Occupational Profile and History  Comprehensive Assessment- Review of records and extensive additional review of physical, cognitive, psychosocial history related to current functional performance    Occupational performance deficits (Please refer to evaluation for details):  ADL's;Leisure;IADL's;Social Participation;Work;Other   parenting role, self esteme, body image   Body Structure / Function / Physical Skills  ADL;Obesity;Decreased knowledge of precautions;Balance;Decreased knowledge of use of DME;Flexibility;IADL;Pain;Skin integrity;Edema;Mobility;ROM;Gait    Rehab Potential  Good    Clinical Decision Making  Several treatment options, min-mod task modification necessary    Comorbidities Affecting Occupational Performance:  Presence of comorbidities impacting occupational performance    Comorbidities impacting occupational performance description:  See SUBJECTIVE    Modification or Assistance to Complete Evaluation   Min-Moderate modification of tasks or assist with assess necessary to complete eval    OT Frequency  2x / week    OT Duration  12 weeks   and PRN   OT Treatment/Interventions  Self-care/ADL training;Manual lymph drainage;Coping strategies training;Patient/family education;Compression bandaging;Therapist, nutritional;Therapeutic activities;Manual Therapy;DME and/or AE instruction;Other (comment)   myofacial release, skin care   Plan  treat one leg at a time to limit fall risk. Fit  with custom compression garments bilaterally needed to achieve correct containment and fit. Consider fitting with 32-chamber, sequential pneumatic compression device (Tactile Medical Flexitouch "pump" ). The Flexitouch SPCD is the only sequential device that duplicates proximal to distal lymphatic decongestion to return interstitial fluid through inguinal and abdominal lymphatics to the heart. A basic pneumatic device is not appropriate for this patient because it does not treat deep abdominal lymphatics essential for transporting lymphatic congestion to the thoracic duct to return to circulation.       Patient will benefit from skilled therapeutic intervention in order to improve the following deficits and impairments:   Body Structure / Function / Physical Skills: ADL, Obesity, Decreased knowledge of precautions, Balance, Decreased knowledge of use of DME, Flexibility, IADL, Pain,  Skin integrity, Edema, Mobility, ROM, Gait       Visit Diagnosis: Lymphedema, not elsewhere classified    Problem List Patient Active Problem List   Diagnosis Date Noted  . 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 Anxiety state 11/28/2009  . Depressive disorder 11/28/2009  . Essential hypertension 11/28/2009   Andrey Spearman, MS, OTR/L, [facility] 08/19/19 11:09 AM   [place] MAIN [facility] SERVICES [address] [place], [place], [address] Phone: [phone]   Fax:  [phone]  Name: Jaclyn Kramer MRN: [id number] Date of Birth: [birth year]

## 2019-08-23 ENCOUNTER — Ambulatory Visit: Payer: Self-pay | Admitting: Occupational Therapy

## 2019-08-23 ENCOUNTER — Other Ambulatory Visit: Payer: Self-pay

## 2019-08-23 DIAGNOSIS — I890 Lymphedema, not elsewhere classified: Secondary | ICD-10-CM

## 2019-08-23 NOTE — Therapy (Signed)
[place] MAIN [facility] SERVICES [address] [place], [place], [address] Phone: [phone]   Fax:  [phone]  Occupational Therapy Treatment  Patient Details  Name: Jaclyn Kramer MRN: [id number] Date of Birth: [birth year] Referring Provider (OT): Linton Ham, MD   Encounter Date: 08/23/2019  OT End of Session - 08/23/19 1120    Number of Visits  72    Date for OT Re-Evaluation  10/10/19    OT Start Time  1020    OT Stop Time  1120    OT Time Calculation (min)  60 min    Activity Tolerance  Patient tolerated treatment well;No increased pain    Behavior During Therapy  [facility] for tasks assessed/performed       No past medical history on file.  No past surgical history on file.  There were no vitals filed for this visit.  Subjective Assessment - 08/23/19 1116    Subjective   Jaclyn Kramer presents for OT visit 47/72 to address BLE lymphedema. Pt is 20  mins late for a 60 min session. Pt presents w RLE custom compression knee high in place , and with LLE gradient compression. Pt reporting 7/10 pain in her R hip and back. Pt denies leg pain. ot EMAILED dme VENDOR ASKING FOR eta ON COMPRESSION STOCKINGS.                   OT Treatments/Exercises (OP) - 08/23/19 0001      ADLs   ADL Education Given  Yes      Manual Therapy   Manual Therapy  Edema management    Edema Management  skin are with low PH cator oil to LLE      during MLD to increase skin hydration and flexibility    Manual Lymphatic Drainage (MLD)  MLD to LLE as established    Compression Bandaging  LLE multilayer gradient compression wrap from base of toes to tibial tuberosity using 8, 10 and 12" short stretch wraps over single layer of 0.4 cm Rosidal foam and cotton stockinett. Circumferential wrap only today             OT Education - 08/23/19 1120    Education Details  Continued skilled Pt/caregiver education  And LE ADL training throughout visit for  lymphedema self care/ home program, including compression wrapping, compression garment and device wear/care, lymphatic pumping ther ex, simple self-MLD, and skin care. Discussed progress towards goals.    Person(s) Educated  Patient    Methods  Explanation;Demonstration    Comprehension  Verbalized understanding;Returned demonstration;Need further instruction          OT Long Term Goals - 07/30/19 1300      OT LONG TERM GOAL #1   Title  Pt will apply BLE, knee length, multi-layer, short stretch compression wraps daily using correct gradient techniques with min caregiver assistance     to achieve optimal limb volume reduction, to return affected limb , as closely as possible, to premorbid size and shape, to limit infection risk, and to improve safe functional ambulation and mobility.    Status  Achieved      OT LONG TERM GOAL #2   Title  Pt to achieve at least 10% BLE limb volume reductions below the knees during Intensive Phase CDT to increase  standing / walking tolerance , to increase functional performance of basic and instrumental ADLs, and to limit LE progression.    Status  Partially Met   Met  for RLE; ongoing for LLE.     OT LONG TERM GOAL #3   Status  --   Met for RLE with 34% limb volume reduction below the knee     OT LONG TERM GOAL #4   Title  Pt will achieve and sustain at least 85%  compliance with daily LE self-care home program (skin care, lymphatic pumping therex, compression and simple self-MLD) ) with min CG assistance during Intensive Phase CDT  to limit  limb swelling, reduce infection risk,  limit associated pain , and limit LE progression.    Status  Achieved      OT LONG TERM GOAL #5   Title  Once issued Pt will be able to don and doff appropriate compression garments and/ or devices using correct techniques and assistive devices with extra time (modified independence) by end of Intensive Phase CDT for optimal LE management to limit progression over time.     Status  Achieved      OT LONG TERM GOAL #6   Title  During self-management phase of CDT Pt will retain limb volume reductions achieved during Intensive Phase CDT with no more than 3% volume increase to limit LE progression and further functional decline.    Status  On-going            Plan - 08/23/19 1204    Clinical Impression Statement  Emphasis of visit on providing MLD, skin care and compression to LLE. Pt continues to demonstrate excwellent tolerance for therapy and progress towards all OT goals. DME vendor confirmed that LLE compression stocking is "in production" since 3/10. Cont as per POC.    OT Occupational Profile and History  Comprehensive Assessment- Review of records and extensive additional review of physical, cognitive, psychosocial history related to current functional performance    Occupational performance deficits (Please refer to evaluation for details):  ADL's;Leisure;IADL's;Social Participation;Work;Other   parenting role, self esteme, body image   Body Structure / Function / Physical Skills  ADL;Obesity;Decreased knowledge of precautions;Balance;Decreased knowledge of use of DME;Flexibility;IADL;Pain;Skin integrity;Edema;Mobility;ROM;Gait    Rehab Potential  Good    Clinical Decision Making  Several treatment options, min-mod task modification necessary    Comorbidities Affecting Occupational Performance:  Presence of comorbidities impacting occupational performance    Comorbidities impacting occupational performance description:  See SUBJECTIVE    Modification or Assistance to Complete Evaluation   Min-Moderate modification of tasks or assist with assess necessary to complete eval    OT Frequency  2x / week    OT Duration  12 weeks   and PRN   OT Treatment/Interventions  Self-care/ADL training;Manual lymph drainage;Coping strategies training;Patient/family education;Compression bandaging;Functional Mobility Training;Therapeutic activities;Manual Therapy;DME and/or  AE instruction;Other (comment)   myofacial release, skin care   Plan  treat one leg at a time to limit fall risk. Fit with custom compression garments bilaterally needed to achieve correct containment and fit. Consider fitting with 32-chamber, sequential pneumatic compression device (Tactile Medical Flexitouch "pump" ). The Flexitouch SPCD is the only sequential device that duplicates proximal to distal lymphatic decongestion to return interstitial fluid through inguinal and abdominal lymphatics to the heart. A basic pneumatic device is not appropriate for this patient because it does not treat deep abdominal lymphatics essential for transporting lymphatic congestion to the thoracic duct to return to circulation.       Patient will benefit from skilled therapeutic intervention in order to improve the following deficits and impairments:   Body Structure / Function / Physical Skills: ADL,   Obesity, Decreased knowledge of precautions, Balance, Decreased knowledge of use of DME, Flexibility, IADL, Pain, Skin integrity, Edema, Mobility, ROM, Gait       Visit Diagnosis: Lymphedema, not elsewhere classified    Problem List Patient Active Problem List   Diagnosis Date Noted  . 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 Anxiety state 11/28/2009  . Depressive disorder 11/28/2009  . Essential hypertension 11/28/2009    Theresa Gilliam, MS, OTR/L, [facility] 08/23/19 12:07 PM  [place]  [facility] [address] [place], [place], [address] Phone: [phone]   Fax:  [phone]  Name: Nairi Pavlich MRN: [id number] Date of Birth: 05/15/1956 

## 2019-08-26 ENCOUNTER — Ambulatory Visit: Payer: Self-pay | Admitting: Occupational Therapy

## 2019-08-27 ENCOUNTER — Other Ambulatory Visit: Payer: Self-pay

## 2019-08-27 ENCOUNTER — Encounter: Payer: Self-pay | Admitting: Occupational Therapy

## 2019-08-27 ENCOUNTER — Ambulatory Visit: Payer: Self-pay | Admitting: Occupational Therapy

## 2019-08-27 DIAGNOSIS — I890 Lymphedema, not elsewhere classified: Secondary | ICD-10-CM

## 2019-08-27 NOTE — Therapy (Signed)
[place] MAIN [facility] SERVICES [address] [place], [place], [address] Phone: [phone]   Fax:  [phone]  Occupational Therapy Treatment  Patient Details  Name: Jaclyn Kramer MRN: [id number] Date of Birth: [birth year] Referring Provider (OT): Linton Ham, MD   Encounter Date: 08/27/2019  OT End of Session - 08/27/19 1055    Visit Number  106       History reviewed. No pertinent past medical history.  History reviewed. No pertinent surgical history.  There were no vitals filed for this visit.  Subjective Assessment - 08/27/19 1008    Subjective   Jaclyn Kramer presents for OT visit 48/72 to address BLE lymphedema. Pt is 25  mins late for a 60 min session. Pt presents w RLE custom compression knee high in place. Pt brings new L custom stocking for fitting.                   OT Treatments/Exercises (OP) - 08/27/19 0001      ADLs   ADL Education Given  Yes      Manual Therapy   Manual Therapy  Edema management    Edema Management  skin are with low PH cator oil to LLE      during MLD to increase skin hydration and flexibility    Manual Lymphatic Drainage (MLD)  MLD to LLE as established    Compression Bandaging  BLE custom compression knee highs             OT Education - 08/27/19 1012    Education Details  Pt edu for transition from Intensive to self-management phase of CDT. After review Pt verbalizes understanding self care protocols and importance of diligent LE self care daily. Pt verbalizes understanding of importance of follow up for chronic progressive nature of LE, and verbalizes understanding of garment replacement schedule.    Person(s) Educated  Patient    Methods  Explanation;Demonstration    Comprehension  Verbalized understanding;Returned demonstration;Need further instruction          OT Long Term Goals - 08/27/19 1000      OT LONG TERM GOAL #1   Title  Pt will apply BLE, knee length,  multi-layer, short stretch compression wraps daily using correct gradient techniques with min caregiver assistance     to achieve optimal limb volume reduction, to return affected limb , as closely as possible, to premorbid size and shape, to limit infection risk, and to improve safe functional ambulation and mobility.    Status  Achieved      OT LONG TERM GOAL #2   Title  Pt to achieve at least 10% BLE limb volume reductions below the knees during Intensive Phase CDT to increase  standing / walking tolerance , to increase functional performance of basic and instrumental ADLs, and to limit LE progression.    Status  Partially Met   Met for RLE; ongoing for LLE.     OT LONG TERM GOAL #3   Status  --   Met for RLE with 34% limb volume reduction below the knee     OT LONG TERM GOAL #4   Title  Pt will achieve and sustain at least 85%  compliance with daily LE self-care home program (skin care, lymphatic pumping therex, compression and simple self-MLD) ) with min CG assistance during Intensive Phase CDT  to limit  limb swelling, reduce infection risk,  limit associated pain , and limit LE progression.  Status  Achieved      OT LONG TERM GOAL #5   Title  Once issued Pt will be able to don and doff appropriate compression garments and/ or devices using correct techniques and assistive devices with extra time (modified independence) by end of Intensive Phase CDT for optimal LE management to limit progression over time.    Status  Achieved      OT LONG TERM GOAL #6   Title  During self-management phase of CDT Pt will retain limb volume reductions achieved during Intensive Phase CDT with no more than 3% volume increase to limit LE progression and further functional decline.    Status  On-going            Plan - 08/27/19 1015    Clinical Impression Statement  Completed fitting for LLE custom compression knee high- ccl3 Elvarex classic. Garment fits well and provides appropriate compression  and containment by initial assessment. Pt will wear garment over the weekend and report on functional assessment next week. During MLD provided Pt edu for LE self care. Pt edu for transition from Intensive to self-management phase of CDT. After review Pt verbalizes understanding self care protocols and importance of diligent LE self care daily. Pt verbalizes understanding of importance of follow up for chronic progressive nature of LE, and verbalizes understanding of garment replacement schedule.If garment fit and function is good to go next week we'l see Jaclyn Kramer back on 66-8 weeks for follow up.    OT Occupational Profile and History  Comprehensive Assessment- Review of records and extensive additional review of physical, cognitive, psychosocial history related to current functional performance    Occupational performance deficits (Please refer to evaluation for details):  ADL's;Leisure;IADL's;Social Participation;Work;Other   parenting role, self esteme, body image   Body Structure / Function / Physical Skills  ADL;Obesity;Decreased knowledge of precautions;Balance;Decreased knowledge of use of DME;Flexibility;IADL;Pain;Skin integrity;Edema;Mobility;ROM;Gait    Rehab Potential  Good    Clinical Decision Making  Several treatment options, min-mod task modification necessary    Comorbidities Affecting Occupational Performance:  Presence of comorbidities impacting occupational performance    Comorbidities impacting occupational performance description:  See SUBJECTIVE    Modification or Assistance to Complete Evaluation   Min-Moderate modification of tasks or assist with assess necessary to complete eval    OT Frequency  2x / week    OT Duration  12 weeks   and PRN   OT Treatment/Interventions  Self-care/ADL training;Manual lymph drainage;Coping strategies training;Patient/family education;Compression bandaging;Therapist, nutritional;Therapeutic activities;Manual Therapy;DME and/or AE  instruction;Other (comment)   myofacial release, skin care   Plan  treat one leg at a time to limit fall risk. Fit with custom compression garments bilaterally needed to achieve correct containment and fit. Consider fitting with 32-chamber, sequential pneumatic compression device (Tactile Medical Flexitouch "pump" ). The Flexitouch SPCD is the only sequential device that duplicates proximal to distal lymphatic decongestion to return interstitial fluid through inguinal and abdominal lymphatics to the heart. A basic pneumatic device is not appropriate for this patient because it does not treat deep abdominal lymphatics essential for transporting lymphatic congestion to the thoracic duct to return to circulation.       Patient will benefit from skilled therapeutic intervention in order to improve the following deficits and impairments:   Body Structure / Function / Physical Skills: ADL, Obesity, Decreased knowledge of precautions, Balance, Decreased knowledge of use of DME, Flexibility, IADL, Pain, Skin integrity, Edema, Mobility, ROM, Gait       Visit  Diagnosis: Lymphedema, not elsewhere classified    Problem List Patient Active Problem List   Diagnosis Date Noted  . 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 Anxiety state 11/28/2009  . Depressive disorder 11/28/2009  . Essential hypertension 11/28/2009    Andrey Spearman, MS, OTR/L, [facility] 08/27/19 10:59 AM  [place] MAIN [facility] SERVICES [address] [place], [place], [address] Phone: [phone]   Fax:  [phone]  Name: Jaclyn Kramer MRN: [id number] Date of Birth: [birth year]

## 2019-08-30 ENCOUNTER — Ambulatory Visit: Payer: Self-pay | Admitting: Occupational Therapy

## 2019-08-30 ENCOUNTER — Other Ambulatory Visit: Payer: Self-pay

## 2019-08-30 DIAGNOSIS — I890 Lymphedema, not elsewhere classified: Secondary | ICD-10-CM

## 2019-08-30 NOTE — Therapy (Signed)
[place] MAIN [facility] SERVICES [address] [place], [place], [address] Phone: [phone]   Fax:  [phone]  Occupational Therapy Treatment  Patient Details  Name: Jaclyn Kramer MRN: [id number] Date of Birth: 05-27-1956 Referring Provider (OT): Linton Ham, MD   Encounter Date: 08/30/2019  OT End of Session - 08/30/19 1613    Visit Number  48    Number of Visits  72    Date for OT Re-Evaluation  10/10/19    OT Start Time  1023    OT Stop Time  1110    OT Time Calculation (min)  47 min    Activity Tolerance  Patient tolerated treatment well;No increased pain    Behavior During Therapy  [facility] for tasks assessed/performed       No past medical history on file.  No past surgical history on file.  There were no vitals filed for this visit.  Subjective Assessment - 08/30/19 1032    Subjective   Jaclyn Kramer presents for OT visit 49/72 to address BLE lymphedema. Pt is 25  mins late for a 60 min session. Pt presents w BLE custom compression knee high in place. Pt  denies leg pain. Pt reports new garment fits well.    Pertinent History  Hx anemia, HTN, anxiety disorder, major depressive disorder, hx non-adherance to medical treatment;    Limitations  limits ambulation, standing tolerance, transfers, bed mobility, ascending and descending stairs, difficulty fitting LB clothing and shoes. difficulty reaching feet (AROM) difficulty bathing    Repetition  Increases Symptoms    Special Tests  + Stemmer dorsal feet Lymphedema Life Impact Scale (LLIS) TBA next visit    Currently in Pain?  No/denies          LYMPHEDEMA/ONCOLOGY QUESTIONNAIRE - 08/30/19 1608      Right Lower Extremity Lymphedema   Other  Final RLE ankle to tibial tuberosity (A-D) = 5137.03 ml    Other  RLE A-De is increased by 19.10% since last measured on 07/12/19, and reduced overall by 20.24% since commencing OT for CDT on 03/17/2019. Goal met and exceeded by 2 x.       Left  Lower Extremity Lymphedema   Other  Final LLE A-D= 4969.35 ml. LLE E-G= 6843.60 ml. LLE A-G= 11728.09 ml.    Other  LLE A-D volume is increwased overall by 1.74% since commencing OT for CDT.                      OT Education - 08/30/19 1102    Education Details  Pt edu for transition from Intensive to Self-Management Phase of CDT. Pt understands not to sleep in daytime compression garments. She understands garment wear and care regimes and is able to perform all LE self care protocols. Pt is able to don and doff custom compression with modified independence using assistive devices and extra time.    Person(s) Educated  Patient    Methods  Explanation;Demonstration    Comprehension  Verbalized understanding;Returned demonstration;Need further instruction          OT Long Term Goals - 08/30/19 1100      OT LONG TERM GOAL #1   Title  Pt will apply BLE, knee length, multi-layer, short stretch compression wraps daily using correct gradient techniques with min caregiver assistance     to achieve optimal limb volume reduction, to return affected limb , as closely as possible, to premorbid size and shape, to limit infection  risk, and to improve safe functional ambulation and mobility.    Status  Achieved      OT LONG TERM GOAL #2   Title  Pt to achieve at least 10% BLE limb volume reductions below the knees during Intensive Phase CDT to increase  standing / walking tolerance , to increase functional performance of basic and instrumental ADLs, and to limit LE progression.    Status  Partially Met   Met for RLE; LLE not met     OT LONG TERM GOAL #3   Status  --   Met for RLE with 34% limb volume reduction below the knee     OT LONG TERM GOAL #4   Title  Pt will achieve and sustain at least 85%  compliance with daily LE self-care home program (skin care, lymphatic pumping therex, compression and simple self-MLD) ) with min CG assistance during Intensive Phase CDT  to limit  limb  swelling, reduce infection risk,  limit associated pain , and limit LE progression.    Status  Achieved      OT LONG TERM GOAL #5   Title  Once issued Pt will be able to don and doff appropriate compression garments and/ or devices using correct techniques and assistive devices with extra time (modified independence) by end of Intensive Phase CDT for optimal LE management to limit progression over time.    Status  Achieved      OT LONG TERM GOAL #6   Title  During self-management phase of CDT Pt will retain limb volume reductions achieved during Intensive Phase CDT with no more than 3% volume increase to limit LE progression and further functional decline.    Status  On-going            Plan - 08/30/19 1614    Clinical Impression Statement  Completed end of Intensive comparative limb volumetrics below the knees. RLE is decreased in volume by 20.24% since commencing CDT 03/17/19. LLE is virtually unchanged in volume today with 1.74% increase measured since starting OT for CDT. Pt edu for transition from Intensive to Self-Management Phase of CDT. Pt understands not to sleep in daytime compression garments. She understands garment wear and care regimes and is able to perform all LE self care protocols. Pt is able to don and doff custom compression with modified independence using assistive devices and extra time.Pt Pt performs LE self care diligently as direcvted. She agrees with plan to return for F/U in 4-6 weeks, and will call PRN.    OT Occupational Profile and History  Comprehensive Assessment- Review of records and extensive additional review of physical, cognitive, psychosocial history related to current functional performance    Occupational performance deficits (Please refer to evaluation for details):  ADL's;Leisure;IADL's;Social Participation;Work;Other   parenting role, self esteme, body image   Body Structure / Function / Physical Skills  ADL;Obesity;Decreased knowledge of  precautions;Balance;Decreased knowledge of use of DME;Flexibility;IADL;Pain;Skin integrity;Edema;Mobility;ROM;Gait    Rehab Potential  Good    Clinical Decision Making  Several treatment options, min-mod task modification necessary    Comorbidities Affecting Occupational Performance:  Presence of comorbidities impacting occupational performance    Comorbidities impacting occupational performance description:  See SUBJECTIVE    Modification or Assistance to Complete Evaluation   Min-Moderate modification of tasks or assist with assess necessary to complete eval    OT Frequency  2x / week    OT Duration  12 weeks   and PRN   OT Treatment/Interventions  Self-care/ADL  training;Manual lymph drainage;Coping strategies training;Patient/family education;Compression bandaging;Therapist, nutritional;Therapeutic activities;Manual Therapy;DME and/or AE instruction;Other (comment)   myofacial release, skin care   Plan  treat one leg at a time to limit fall risk. Fit with custom compression garments bilaterally needed to achieve correct containment and fit. Consider fitting with 32-chamber, sequential pneumatic compression device (Tactile Medical Flexitouch "pump" ). The Flexitouch SPCD is the only sequential device that duplicates proximal to distal lymphatic decongestion to return interstitial fluid through inguinal and abdominal lymphatics to the heart. A basic pneumatic device is not appropriate for this patient because it does not treat deep abdominal lymphatics essential for transporting lymphatic congestion to the thoracic duct to return to circulation.       Patient will benefit from skilled therapeutic intervention in order to improve the following deficits and impairments:   Body Structure / Function / Physical Skills: ADL, Obesity, Decreased knowledge of precautions, Balance, Decreased knowledge of use of DME, Flexibility, IADL, Pain, Skin integrity, Edema, Mobility, ROM, Gait       Visit  Diagnosis: Lymphedema, not elsewhere classified    Problem List Patient Active Problem List   Diagnosis Date Noted  . 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 Anxiety state 11/28/2009  . Depressive disorder 11/28/2009  . Essential hypertension 11/28/2009    Andrey Spearman, MS, OTR/L, [facility] 08/30/19 4:17 PM  [place] MAIN [facility] SERVICES [address] [place], [place], [address] Phone: [phone]   Fax:  [phone]  Name: Jaclyn Kramer MRN: [id number] Date of Birth: March 26, 1956

## 2019-09-01 ENCOUNTER — Ambulatory Visit: Payer: Self-pay | Admitting: Occupational Therapy

## 2019-09-06 ENCOUNTER — Ambulatory Visit: Payer: Self-pay | Admitting: Occupational Therapy

## 2019-09-09 ENCOUNTER — Encounter: Payer: Self-pay | Admitting: Occupational Therapy

## 2019-09-14 ENCOUNTER — Encounter: Payer: Self-pay | Admitting: Occupational Therapy

## 2019-09-16 ENCOUNTER — Encounter: Payer: Self-pay | Admitting: Occupational Therapy

## 2019-09-21 ENCOUNTER — Encounter: Payer: Self-pay | Admitting: Occupational Therapy

## 2019-09-23 ENCOUNTER — Encounter: Payer: Self-pay | Admitting: Occupational Therapy

## 2019-10-04 ENCOUNTER — Ambulatory Visit: Payer: Self-pay | Admitting: Occupational Therapy

## 2019-10-07 ENCOUNTER — Other Ambulatory Visit: Payer: Self-pay

## 2019-10-07 ENCOUNTER — Ambulatory Visit: Payer: Self-pay | Attending: Internal Medicine | Admitting: Occupational Therapy

## 2019-10-07 DIAGNOSIS — I890 Lymphedema, not elsewhere classified: Secondary | ICD-10-CM | POA: Insufficient documentation

## 2019-10-07 NOTE — Therapy (Signed)
[place] MAIN [facility] SERVICES [address], [place], [address] Phone: [phone]   Fax:  [phone]  Occupational Therapy Treatment  Patient Details  Name: Jaclyn Kramer MRN: [id number] Date of Birth: Mar 02, 1956 Referring Provider (OT): Linton Ham, MD   Encounter Date: 10/07/2019  OT End of Session - 10/07/19 1546    Visit Number  50    Number of Visits  72    Date for OT Re-Evaluation  10/10/19    OT Start Time  0105    OT Stop Time  0210    OT Time Calculation (min)  65 min    Activity Tolerance  Patient tolerated treatment well;No increased pain    Behavior During Therapy  [facility] for tasks assessed/performed       No past medical history on file.  No past surgical history on file.  There were no vitals filed for this visit.  Subjective Assessment - 10/07/19 1546    Subjective   Jaclyn Kramer presents for OT visit 49/72 to address BLE lymphedema. Pt is 25  mins late for a 60 min session. Pt presents w BLE custom compression knee high in place. Pt  denies leg pain. Pt reports new garment fits well.    Pertinent History  Hx anemia, HTN, anxiety disorder, major depressive disorder, hx non-adherance to medical treatment;    Limitations  limits ambulation, standing tolerance, transfers, bed mobility, ascending and descending stairs, difficulty fitting LB clothing and shoes. difficulty reaching feet (AROM) difficulty bathing    Repetition  Increases Symptoms    Special Tests  + Stemmer dorsal feet Lymphedema Life Impact Scale (LLIS) TBA next visit          LYMPHEDEMA/ONCOLOGY QUESTIONNAIRE - 10/07/19 1352      Right Lower Extremity Lymphedema   Other  RLE ankle to tibial tuberosity (A-D) = 4590.23 ml    Other  RLE A-D limb volume is decreased by 10.64% since last measured on 08/30/19, and reduced overall by 24.69% since commencing OT for CDT on 03/17/2019.     Other  limb volume diffierential (LVD) measures 2.0%, L>R, which is  WNL      Left Lower Extremity Lymphedema   Other   LLE A-D= 4682.17 ml.     Other  LLE A-D volume is decreased by 5.8% since last measured on 08/30/19, and by 4.14 %overall since commencing CDT and by               OT Treatments/Exercises (OP) - 10/07/19 0001      ADLs   ADL Education Given  Yes      Manual Therapy   Manual Therapy  Edema management    Manual therapy comments  garments in good shape.     Edema Management  BLE comparative limb volumes                  OT Long Term Goals - 10/07/19 1500      OT LONG TERM GOAL #1   Title  Pt will apply BLE, knee length, multi-layer, short stretch compression wraps daily using correct gradient techniques with min caregiver assistance     to achieve optimal limb volume reduction, to return affected limb , as closely as possible, to premorbid size and shape, to limit infection risk, and to improve safe functional ambulation and mobility.    Status  Achieved      OT LONG TERM GOAL #2   Title  Pt to  achieve at least 10% BLE limb volume reductions below the knees during Intensive Phase CDT to increase  standing / walking tolerance , to increase functional performance of basic and instrumental ADLs, and to limit LE progression.    Status  Partially Met   Met for RLE; LLE not met     OT LONG TERM GOAL #3   Title  Pt will be able to verbalize signs and symptoms of cellulitis infection and identify 4 common lymphedema precautions using printed resource for reference (modified independence) to limit LE progression over time .    Status  Achieved   Met for RLE with 34% limb volume reduction below the knee     OT LONG TERM GOAL #4   Title  Pt will achieve and sustain at least 85%  compliance with daily LE self-care home program (skin care, lymphatic pumping therex, compression and simple self-MLD) ) with min CG assistance during Intensive Phase CDT  to limit  limb swelling, reduce infection risk,  limit associated pain , and  limit LE progression.    Status  Achieved      OT LONG TERM GOAL #5   Title  Once issued Pt will be able to don and doff appropriate compression garments and/ or devices using correct techniques and assistive devices with extra time (modified independence) by end of Intensive Phase CDT for optimal LE management to limit progression over time.    Status  Achieved      OT LONG TERM GOAL #6   Title  During self-management phase of CDT Pt will retain limb volume reductions achieved during Intensive Phase CDT with no more than 3% volume increase to limit LE progression and further functional decline.    Status  On-going            Plan - 10/07/19 1354    Clinical Impression Statement  Pt returns for 4 week f/u/ Completed comparative  below the knee volumetrics. RLE A-De is decreased by 10.64% since last measured on 08/30/19, and reduced overall by 24.69% since commencing OT for CDT on 03/17/2019. Goal met and exceeded by > 2 x. LLE A-D volume is decreased by 5.8% since last measured on 08/30/19, and decreased by 4.14 %overal. Limb volume differential measures 2.0% today, which is WNL. Initial LVD measured 24.16%, R>L. Custom compression garments are in good use. Pt is 100% compliant w/ all LE seof care protocols, including frequent use of her advanced sequential pneumatic compression "pump". Skin is in excellent condition and infection risk is dramatically reduced. Pt is in agreement with plan to return to OT for LE care follow along in 3 months. We'll complete repeat volumetrics and anatomical measurements for new compression stocking replacements.    OT Occupational Profile and History  Comprehensive Assessment- Review of records and extensive additional review of physical, cognitive, psychosocial history related to current functional performance    Occupational performance deficits (Please refer to evaluation for details):  ADL's;Leisure;IADL's;Social Participation;Work;Other   parenting role, self  esteme, body image   Body Structure / Function / Physical Skills  ADL;Obesity;Decreased knowledge of precautions;Balance;Decreased knowledge of use of DME;Flexibility;IADL;Pain;Skin integrity;Edema;Mobility;ROM;Gait    Rehab Potential  Good    Clinical Decision Making  Several treatment options, min-mod task modification necessary    Comorbidities Affecting Occupational Performance:  Presence of comorbidities impacting occupational performance    Comorbidities impacting occupational performance description:  See SUBJECTIVE    Modification or Assistance to Complete Evaluation   Min-Moderate modification of tasks or  assist with assess necessary to complete eval    OT Frequency  2x / week    OT Duration  12 weeks   and PRN   OT Treatment/Interventions  Self-care/ADL training;Manual lymph drainage;Coping strategies training;Patient/family education;Compression bandaging;Therapist, nutritional;Therapeutic activities;Manual Therapy;DME and/or AE instruction;Other (comment)   myofacial release, skin care   Plan  treat one leg at a time to limit fall risk. Fit with custom compression garments bilaterally needed to achieve correct containment and fit. Consider fitting with 32-chamber, sequential pneumatic compression device (Tactile Medical Flexitouch "pump" ). The Flexitouch SPCD is the only sequential device that duplicates proximal to distal lymphatic decongestion to return interstitial fluid through inguinal and abdominal lymphatics to the heart. A basic pneumatic device is not appropriate for this patient because it does not treat deep abdominal lymphatics essential for transporting lymphatic congestion to the thoracic duct to return to circulation.       Patient will benefit from skilled therapeutic intervention in order to improve the following deficits and impairments:   Body Structure / Function / Physical Skills: ADL, Obesity, Decreased knowledge of precautions, Balance, Decreased knowledge  of use of DME, Flexibility, IADL, Pain, Skin integrity, Edema, Mobility, ROM, Gait       Visit Diagnosis: Lymphedema, not elsewhere classified    Problem List Patient Active Problem List   Diagnosis Date Noted  . 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 Anxiety state 11/28/2009  . Depressive disorder 11/28/2009  . Essential hypertension 11/28/2009    Andrey Spearman, MS, OTR/L, [facility] 10/07/19 3:59 PM  [place] MAIN [facility] SERVICES [address] [place], [place], [address] Phone: [phone]   Fax:  [phone]  Name: Jaclyn Kramer MRN: [id number] Date of Birth: 1956/01/27

## 2019-10-25 ENCOUNTER — Ambulatory Visit: Payer: Self-pay | Admitting: Podiatry

## 2019-11-02 ENCOUNTER — Ambulatory Visit: Payer: Self-pay | Attending: Internal Medicine

## 2019-11-02 DIAGNOSIS — Z23 Encounter for immunization: Secondary | ICD-10-CM

## 2019-11-02 NOTE — Progress Notes (Signed)
   Covid-19 Vaccination Clinic  Name:  Shantera Monts    MRN: [id number] DOB: Sep 23, 1955  11/02/2019  Ms. Inscore was observed post Covid-19 immunization for 15 minutes without incident. She was provided with Vaccine Information Sheet and instruction to access the V-Safe system.   Ms. Grajeda was instructed to call 911 with any severe reactions post vaccine: Marland Kitchen Difficulty breathing  . Swelling of face and throat  . A fast heartbeat  . A bad rash all over body  . Dizziness and weakness   Immunizations Administered    Name Date Dose VIS Date Route   Pfizer COVID-19 Vaccine 11/02/2019  1:05 PM 0.3 mL 08/04/2018 Intramuscular   Manufacturer: [employer], [employer]   Lot: [id number]   NDC: [address]-4

## 2019-11-04 ENCOUNTER — Ambulatory Visit: Payer: Self-pay | Admitting: Podiatry

## 2019-11-18 ENCOUNTER — Ambulatory Visit: Payer: Self-pay | Admitting: Podiatry

## 2019-11-23 ENCOUNTER — Ambulatory Visit: Payer: Self-pay

## 2019-11-26 ENCOUNTER — Ambulatory Visit: Payer: Self-pay | Attending: Internal Medicine

## 2019-11-26 DIAGNOSIS — Z23 Encounter for immunization: Secondary | ICD-10-CM

## 2019-11-26 NOTE — Progress Notes (Signed)
   Covid-19 Vaccination Clinic  Name:  Jaclyn Kramer    MRN: [id number] DOB: 08-07-1955  11/26/2019  [name] was observed post Covid-19 immunization for 15 minutes without incident. She was provided with Vaccine Information Sheet and instruction to access the V-Safe system.   [name] was instructed to call 911 with any severe reactions post vaccine: Marland Kitchen Difficulty breathing  . Swelling of face and throat  . A fast heartbeat  . A bad rash all over body  . Dizziness and weakness   Immunizations Administered    Name Date Dose VIS Date Route   Pfizer COVID-19 Vaccine 11/26/2019 10:16 AM 0.3 mL 08/04/2018 Intramuscular   Manufacturer: [employer], [employer]   Lot: [id number]   NDC: [address]-9

## 2020-01-04 ENCOUNTER — Ambulatory Visit: Payer: Self-pay | Admitting: Occupational Therapy

## 2020-01-05 ENCOUNTER — Ambulatory Visit: Payer: Self-pay | Admitting: Occupational Therapy

## 2020-01-06 ENCOUNTER — Other Ambulatory Visit: Payer: Self-pay

## 2020-01-06 ENCOUNTER — Ambulatory Visit: Payer: Self-pay | Attending: Physician Assistant | Admitting: Occupational Therapy

## 2020-01-06 DIAGNOSIS — I890 Lymphedema, not elsewhere classified: Secondary | ICD-10-CM | POA: Insufficient documentation

## 2020-01-06 NOTE — Therapy (Signed)
[place] MAIN [facility] SERVICES [address] [place], [place], [address] Phone: [phone]   Fax:  [phone]  Occupational Therapy Treatment Note and Progress Report: Lymphedema Care  Patient Details  Name: Jaclyn Kramer MRN: [id number] Date of Birth: 28-Dec-1955 Referring Provider (OT): Linton Ham, MD   Encounter Date: 01/06/2020   OT End of Session - 01/06/20 0932    Visit Number 50    Number of Visits 72    Date for OT Re-Evaluation 04/05/20    OT Start Time 0212    Activity Tolerance Patient tolerated treatment well;No increased pain    Behavior During Therapy [facility] for tasks assessed/performed           No past medical history on file.  No past surgical history on file.  There were no vitals filed for this visit.   Subjective Assessment - 01/06/20 1430    Subjective  Jaclyn Kramer presents for OT visit 49/72 to address BLE lymphedema. Pt is 20  mins late for a 60 min follow up session. Pt presents w BLE custom compression knee high in place. Pt  denies leg pain. Pt reports new garment fits well.    Pertinent History Hx anemia, HTN, anxiety disorder, major depressive disorder, hx non-adherance to medical treatment;    Limitations limits ambulation, standing tolerance, transfers, bed mobility, ascending and descending stairs, difficulty fitting LB clothing and shoes. difficulty reaching feet (AROM) difficulty bathing    Repetition Increases Symptoms    Special Tests + Stemmer dorsal feet Lymphedema Life Impact Scale (LLIS) TBA next visit               LYMPHEDEMA/ONCOLOGY QUESTIONNAIRE - 01/06/20 0001      Right Lower Extremity Lymphedema   Other RLE ankle to tibial tuberosity (A-D) = 4644.2 ml    Other RLE is stable with 1.2% increase in volume. LTG met      Left Lower Extremity Lymphedema   Other  LLE A-D= 4690.3 ml.     Other LLE A-D volume is essentially stable with 0.17% increase.                   OT  Treatments/Exercises (OP) - 01/06/20 0001      ADLs   ADL Education Given Yes      Manual Therapy   Manual Therapy Edema management    Manual therapy comments garments in good shape.     Edema Management BLE comparative limb volumes                  OT Education - 01/06/20 1510    Education Details Pt edu for LE management over time to limit progression and infection risk. Pt edu for outcome of f/u limb volumetrics    Person(s) Educated Patient    Methods Explanation;Demonstration    Comprehension Verbalized understanding;Returned demonstration;Need further instruction               OT Long Term Goals - 01/06/20 1500      OT LONG TERM GOAL #6   Title During self-management phase of CDT Pt will retain limb volume reductions achieved during Intensive Phase CDT with no more than 3% volume increase to limit LE progression and further functional decline.    Status Achieved   for 10/07/19 thru 01/06/20 up 1.2% on R and 0.17% on L     OT LONG TERM GOAL #7   Title Pt will replace BLE custom knee length compression garments  with like garments within 3 months to limit volumetric increases bilaterally and LE progression. (Medicaid application filed today, 01/06/20, by report)    Baseline Max A    Time 3    Period Months    Status New    Target Date 04/05/20                 Plan - 01/06/20 1528    Clinical Impression Statement Jaclyn Kramer attends 3 month follow along appointment today for check on lymphedema status. BLE comparative limb volumetrics reveals Pt has met goal for limiting volume increases > than 3% over time  for boyj legs today. Limb volumes are essentially stable at present even with compression garments showing normal wear, mild weight gain and very warm outdoor summer temperatures. Pt is >85% compliant with sequential pneumatic device as directed and wears compression garments daily. She is diligent with skin care and self MLD. Custom garments are in need of  replacement for optimal effectiveness. Pt will benefit from ongoing OT support for long term lymphedema self management over time to limit progression and further functinal decine. Pt agrees with plan to return for replacement garment measurements in 3 months, or as soon as Medicaid is approved. Pt will call with status update if there is a delay.    OT Occupational Profile and History Comprehensive Assessment- Review of records and extensive additional review of physical, cognitive, psychosocial history related to current functional performance    Occupational performance deficits (Please refer to evaluation for details): ADL's;Leisure;IADL's;Social Participation;Work;Other   parenting role, self esteme, body image   Body Structure / Function / Physical Skills ADL;Obesity;Decreased knowledge of precautions;Balance;Decreased knowledge of use of DME;Flexibility;IADL;Pain;Skin integrity;Edema;Mobility;ROM;Gait    Rehab Potential Good    Clinical Decision Making Several treatment options, min-mod task modification necessary    Comorbidities Affecting Occupational Performance: Presence of comorbidities impacting occupational performance    Comorbidities impacting occupational performance description: See SUBJECTIVE    Modification or Assistance to Complete Evaluation  Min-Moderate modification of tasks or assist with assess necessary to complete eval    OT Frequency 2x / week    OT Duration 12 weeks   and PRN   OT Treatment/Interventions Self-care/ADL training;Manual lymph drainage;Coping strategies training;Patient/family education;Compression bandaging;Building services engineer;Therapeutic activities;Manual Therapy;DME and/or AE instruction;Other (comment)   myofacial release, skin care   Plan treat one leg at a time to limit fall risk. Fit with custom compression garments bilaterally needed to achieve correct containment and fit. Consider fitting with 32-chamber, sequential pneumatic compression device  (T[employer] Flexitouch "pump" ). The Flexitouch SPCD is the only sequential device that duplicates proximal to distal lymphatic decongestion to return interstitial fluid through inguinal and abdominal lymphatics to the heart. A basic pneumatic device is not appropriate for this patient because it does not treat deep abdominal lymphatics essential for transporting lymphatic congestion to the thoracic duct to return to circulation.           Patient will benefit from skilled therapeutic intervention in order to improve the following deficits and impairments:   Body Structure / Function / Physical Skills: ADL, Obesity, Decreased knowledge of precautions, Balance, Decreased knowledge of use of DME, Flexibility, IADL, Pain, Skin integrity, Edema, Mobility, ROM, Gait       Visit Diagnosis: Lymphedema, not elsewhere classified - Plan: Ot plan of care cert/re-cert    Problem List Patient Active Problem List   Diagnosis Date Noted  . 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 Anxiety state 11/28/2009  . Depressive disorder  11/28/2009  . Essential hypertension 11/28/2009    Andrey Spearman, MS, OTR/L, [facility] 01/06/20 3:37 PM  [place] MAIN [facility] SERVICES [address] [place], [place], [address] Phone: [phone]   Fax:  [phone]  Name: Jaclyn Kramer MRN: [id number] Date of Birth: 18-Sep-1955

## 2020-06-30 ENCOUNTER — Ambulatory Visit: Payer: Self-pay

## 2020-07-28 ENCOUNTER — Ambulatory Visit: Payer: Self-pay | Attending: Internal Medicine

## 2020-07-28 ENCOUNTER — Other Ambulatory Visit (HOSPITAL_COMMUNITY): Payer: Self-pay | Admitting: Internal Medicine

## 2020-07-28 DIAGNOSIS — Z23 Encounter for immunization: Secondary | ICD-10-CM

## 2020-07-28 NOTE — Progress Notes (Signed)
   Covid-19 Vaccination Clinic  Name:  Jaclyn Kramer    MRN: [id number] DOB: 05/14/1956  07/28/2020  Jaclyn Kramer was observed post Covid-19 immunization for 15 minutes without incident. She was provided with Vaccine Information Sheet and instruction to access the V-Safe system.   Jaclyn Kramer was instructed to call 911 with any severe reactions post vaccine: Marland Kitchen Difficulty breathing  . Swelling of face and throat  . A fast heartbeat  . A bad rash all over body  . Dizziness and weakness   Immunizations Administered    Name Date Dose VIS Date Route   PFIZER Comrnaty(Gray TOP) Covid-19 Vaccine 07/28/2020 12:31 PM 0.3 mL 05/18/2020 Intramuscular   Manufacturer: [employer], [employer]   Lot: [id number]   NDC: [phone]

## 2020-08-03 ENCOUNTER — Ambulatory Visit: Payer: Self-pay | Admitting: Occupational Therapy

## 2020-08-08 ENCOUNTER — Ambulatory Visit: Payer: Self-pay | Admitting: Occupational Therapy

## 2020-08-11 ENCOUNTER — Ambulatory Visit: Payer: Self-pay | Admitting: Occupational Therapy

## 2020-08-16 ENCOUNTER — Other Ambulatory Visit: Payer: Self-pay

## 2020-08-16 ENCOUNTER — Ambulatory Visit: Payer: Self-pay | Attending: Physician Assistant | Admitting: Occupational Therapy

## 2020-08-16 ENCOUNTER — Encounter: Payer: Self-pay | Admitting: Occupational Therapy

## 2020-08-16 DIAGNOSIS — I890 Lymphedema, not elsewhere classified: Secondary | ICD-10-CM | POA: Insufficient documentation

## 2020-08-16 NOTE — Patient Instructions (Signed)

## 2020-08-17 NOTE — Therapy (Signed)
[place] MAIN [facility] SERVICES [address] [place], [place], [address] Phone: [phone]   Fax:  [phone]  Occupational Therapy Evaluation  Patient Details  Name: Jaclyn Kramer MRN: [id number] Date of Birth: [birth year] Referring Provider (OT): Jeri Cos, [place]   Encounter Date: 08/16/2020   OT End of Session - 08/16/20 1501    Visit Number 1    Number of Visits 36    Date for OT Re-Evaluation 11/14/20    OT Start Time 0307    OT Stop Time 0408    OT Time Calculation (min) 61 min    Activity Tolerance Patient tolerated treatment well;No increased pain    Behavior During Therapy [facility] for tasks assessed/performed           History reviewed. No pertinent past medical history.  History reviewed. No pertinent surgical history.  There were no vitals filed for this visit.   Subjective Assessment - 08/16/20 1438    Subjective  Jaclyn Kramer is referred to Occupational Therapy for evaluation and treatment of BLE lymphedema by Jeri Cos, III, PA-C. Jaclyn Kramer is well known to this therapist. She sucessfully completed Complete Decongestive Therapy, both Intensive and Management Phase courses, with an excellent response to treatment  with goal range limb volume reductions and excellent compliance with self-care. Jaclyn Kramer was fitted with BLE custom , flat knit , ccl 3  ( 34-46 mmHg) Jobst ELVAREX compression stockings and was able to obtain an advanced sequential pneumatic compression device (Flexitouch). Ms Jaclyn Kramer was last seen on 01/06/20.She returns today for assistance with custom compression garment replacement. She denies LE related pain.    Pertinent History Hx anemia, HTN, anxiety disorder, major depressive disorder, hx non-adherance to medical treatment;    Limitations limits ambulation, standing tolerance, transfers, bed mobility, ascending and descending stairs, difficulty fitting LB clothing and shoes. difficulty reaching feet (AROM)  difficulty bathing    Repetition Increases Symptoms    Special Tests + Stemmer dorsal feet Lymphedema Life Impact Scale (LLIS) TBA next visit    Pain Onset --    Pain Frequency Intermittent    Aggravating Factors  weightbearing, standing, walking, dependent sitting > 20 mins    Pain Relieving Factors elevation, pain medicine, rubbing, MLD, Flexitouch device, compression stockings    Effect of Pain on Daily Activities BLE pain swelling and associated pain limits functional ambulation and mobility, transfers, basic ADLs (bathing, LB dressing, grooming), intrumental ADLs, ( home management tasks/ activities, role performace as caregiver to daughter and aging parents, ) limits productive and work activities requiring standing, wal;king or extended dependednt sitting, limits social participation in the community, including church, limits body image and self esteme 2/2 disfigurement             [facility] OT Assessment - 08/16/20 1452      Assessment   Medical Diagnosis Mod, stage II, BLE lymphedema tarda    Referring Provider (OT) Jeri Cos, PA-C    Hand Dominance Right    Prior Therapy none      Precautions   Precaution Comments LE skin precautions      Balance Screen   Has the patient fallen in the past 6 months No    Has the patient had a decrease in activity level because of a fear of falling?  No      Home  Environment   Living Arrangements Other(Comment)    Available Help at Discharge Family   Pt has recently become primary CG in  her home when her elder brother passed away. She now cares for 2 elderly parents, 2 profoundly impaired adult brothers, her son with special needs, family pets, and other adult siblings residing at home.   Type of Home House    Home Access Stairs    Alternate Level Stairs - Number of Steps 1 story    Bathroom Shower/Tub Tub/Shower unit;Curtain    Shower/tub characteristics Curtain    Bathroom Toilet Handicapped height    Bathroom Accessibility No     Additional Comments have tub bench , but not currently using    Lives With Family   mother, father, son, 2 brothers     Prior Function   Level of Independence Independent;Independent with household mobility without device;Independent with community mobility without device;Independent with transfers;Needs assistance with homemaking;Needs assistance with transfers;Needs assistance with ADLs    Vocation Unemployed    Leisure family      IADL   Prior Level of Function Shopping I    Shopping Takes care of all shopping needs independently    Prior Level of Function Light Housekeeping mod A    Light Housekeeping Launders small items, rinses stockings, etc.;Performs light daily tasks but cannot maintain acceptable level of cleanliness;Needs help with all home maintenance tasks;Does personal laundry completely    Prior Level of Function Meal Prep I    Meal Prep Able to complete simple cold meal and snack prep;Plans, prepares and serves adequate meals independently    Prior Level of Function Psychologist, prison and probation services Drives own vehicle      Mobility   Mobility Status Independent      Activity Tolerance   Activity Tolerance Endurance does not limit participation in activity    Activity Tolerance Comments endurance does not limit activity tolerance. AT is limited by increased leg pain and swelling when standing, walking, and/or sitting in dependent position > 20 mins      Cognition   Overall Cognitive Status Within Functional Limits for tasks assessed    Difficult to assess due to --   mother frequently corrects Pt history   Cognition Comments Pr grieving recent death of close brother      Posture/Postural Control   Posture/Postural Control No significant limitations      Sensation   Additional Comments light and deep touch in tact      Coordination   Gross Motor Movements are Fluid and Coordinated Yes    Fine Motor Movements are Fluid and Coordinated Yes      ROM /  Strength   AROM / PROM / Strength AROM;Strength      AROM   Overall AROM  Within functional limits for tasks performed      Strength   Overall Strength Within functional limits for tasks performed      Hand Function   Right Hand Gross Grasp Functional    Right Hand Grip (lbs) WFL    Left Hand Gross Grasp Functional    Left Hand Grip (lbs) WFL                    OT Treatments/Exercises (OP) - 08/16/20 1457      Transfers   Transfers Sit to Stand;Stand to Sit      ADLs   Overall ADLs difficuolty fitting street shoes and LB clothing dueto chronic LE swelling and associated pain    LB Dressing diffic7ulty fitting street shoes and LB clothing due to leg swelling  Functional Mobility extra time due to limb swelling and associated pain    Leisure impaired due to caregiving demands    ADL Education Given Yes      Manual Therapy   Manual Therapy Edema management    Manual therapy comments garments worn out and need replacement    Edema Management BLE comparative limb volumes                 OT Education - 08/16/20 1500    Education Details Provided Pt education regarding lymphatic structure and function, etiologies, onset patterns and stages of progression. Discussed  impact of obesity on lymphatic function. Outlined Complete Decongestive Therapy (CDT)  as standard of care and provided in depth information regarding 4 primary components of both Intensive and Self Management Phases, including Manual Lymph Drainage (MLD), compression wrapping and garments, skin care, and therapeutic exercise.   Pilar Plate discussion of high burden of care and contributing impact of existing co morbidities. We discussed  Importance of daily, ongoing LE self-care essential to retaining clinical gains and limiting progression.  Lastly, reviewed lymphedema precautions, including cellulitis risk and difficulty with wound healing. Provided printed Lymphedema Workbook for reference. Pt verbalized  understanding that assistance with home program is essential to meet OT rehab goals for LE care.    Person(s) Educated Patient    Methods Explanation;Demonstration    Comprehension Verbalized understanding;Returned demonstration;Need further instruction               OT Long Term Goals - 08/17/20 1156      OT LONG TERM GOAL #1   Title Given this patient's risk-adjustment variables, and the actual Intake functional score on the FOTO tool, patient will experience at least an increase in function of 4 points (to 68 or higher) to improve funtional performance in lymphedema self care to limit progression.    Baseline Mod A    Time 8    Period Weeks    Status New    Target Date 11/15/20      OT LONG TERM GOAL #2   Title Pt to achieve 3% RLE limb volume reduction from ankle to tibial tuberosity (A-D) to to improve ankle flexibility and to limit LE progression    Baseline Mod A    Time 8    Period Weeks    Status New   Met for RLE; LLE not met   Target Date 11/15/20      OT LONG TERM GOAL #3   Title Pt will be able to verbalize signs and symptoms of cellulitis infection and identify 4 common lymphedema precautions using printed resource for reference (modified independence) to limit LE progression over time .    Baseline Mod A    Time 12    Period Weeks    Status New   Met for RLE with 34% limb volume reduction below the knee     OT LONG TERM GOAL #4   Title Pt will demonstrate at least 85%  compliance with all daily LE self-care home program  components, including skin care, lymphatic pumping therex, compression and simple self-MLD with modified independence (extra time) and limit LE progression.    Baseline mod A    Time 12    Period Weeks    Status New      OT LONG TERM GOAL #5   Title Once issued Pt will be able to don and doff appropriate compression garments and/ or devices using correct techniques and assistive devices with extra  time (modified independence) by end of  Intensive Phase CDT for optimal LE management to limit progression over time.    Baseline modified assistance (extra time)    Time 12    Period Weeks    Status Achieved    Target Date 11/15/20      OT LONG TERM GOAL #6   Title During self-management phase of CDT Pt will retain limb volume reductions achieved during Intensive Phase CDT with no more than 3% volume increase to limit LE progression and further functional decline.    Baseline Min A    Time 3    Period Months    Status On-going    Target Date 02/13/21      OT LONG TERM GOAL #7   Title Pt will replace compression garments/ devices q 3-6 months and PRN to limit LE progression and infection risk, and to limit further functional decline.    Baseline Max A    Time 3    Period Months    Status New    Target Date 11/15/20                 Plan - 08/16/20 1609    Clinical Impression Statement BLE comparative limb volumetrics reveal a 3.5% limb volume increase from ankle to tibial tuberosisty (A-D) in the R leg and a 0.58% VOLUME INCREASE BELOW THE KNEE  iin the R leg. These values are eare within long term goal range and are evidence of excellent LE self management over time. Patient's intake functional measure  on the FOTO tool is 64 on a scale of 0 - 100 (higher number = greater function). Given the patient's risk-adjustment variables, like-patients nationally had a FS score of 56 at intake. Pt's skin is dry, flaking and presenting with hyperkeritosis on the anterior surface of toes. Mild dermal fibrosis is palpable in ankles and distal legs. Skin is slightly stained suggesting venous disease may be contributing factor to LE. Stemmer sign is mildly positive bilaterally. Existing custom compression stockings are worn out and in need of replacement. Replace with like garments ASAP and provide MLD weekly while awaiting garment delivery to improve skin condition and limit LE progression. Pt in agreement with plan going forward. Pt  recently applied for Medicaid coverage. Hopefully this insurance program is approved as it typically coveres custom compression. If Medicaid is not approved, we will seek alternative funding through family gifts and charitable foundations    in and out of [employer].    OT Occupational Profile and History Comprehensive Assessment- Review of records and extensive additional review of physical, cognitive, psychosocial history related to current functional performance    Occupational performance deficits (Please refer to evaluation for details): ADL's;Leisure;IADL's;Social Participation;Work;Other   parenting role, self esteme, body image   Body Structure / Function / Physical Skills ADL;Decreased knowledge of precautions;Decreased knowledge of use of DME;Flexibility;IADL;Pain;Skin integrity;Edema;Mobility;ROM;Balance    Rehab Potential Good    Clinical Decision Making Several treatment options, min-mod task modification necessary    Comorbidities Affecting Occupational Performance: Presence of comorbidities impacting occupational performance    Comorbidities impacting occupational performance description: See SUBJECTIVE    Modification or Assistance to Complete Evaluation  Min-Moderate modification of tasks or assist with assess necessary to complete eval    OT Frequency 1x / week    OT Duration 12 weeks   and PRN   OT Treatment/Interventions Self-care/ADL training;Manual lymph drainage;Coping strategies training;Patient/family education;Therapeutic activities;Manual Therapy;DME and/or AE instruction;Other (comment);Compression bandaging;Therapeutic exercise  fibrosis techniques, skin care , fit with replacements for existing , worn out, custom compression garments.   Plan CDT x 1 weekly to reduce limb volume, improve skin hydration and limit infection risk while waiting for Medicaid approval to complete garment replacement. At present Pt has no health insurance. One Medicaid is in lace we'll measure  and fit with replacements for existing custom compression garments and HOS devices. No bandaging; we'll use existing compression garments after each session. CDT to include MLD, skin care, ther ex and compression. Pt will use sequential pneumatic compression device (Flexitouch) between visits at home.    Recommended Other Services replace BLE custom Elvarex A-D, ccl 3 , flat knit stockings. Fit with BLE custom Jobst RELAX A-D to limit fibrosis formation and improve lymphatic function during HOS.    Consulted and Agree with Plan of Care Patient           Patient will benefit from skilled therapeutic intervention in order to improve the following deficits and impairments:   Body Structure / Function / Physical Skills: ADL,Decreased knowledge of precautions,Decreased knowledge of use of DME,Flexibility,IADL,Pain,Skin integrity,Edema,Mobility,ROM,Balance       Visit Diagnosis: Lymphedema, not elsewhere classified - Plan: Ot plan of care cert/re-cert    Problem List Patient Active Problem List   Diagnosis Date Noted  . 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 Anxiety state 11/28/2009  . Depressive disorder 11/28/2009  . Essential hypertension 11/28/2009    Andrey Spearman, MS, OTR/L, [facility] 08/17/20 12:15 PM   [place] MAIN [facility] SERVICES [address] [place], [place], [address] Phone: [phone]   Fax:  [phone]  Name: Roby Spalla MRN: [id number] Date of Birth: [birth year]

## 2020-08-23 ENCOUNTER — Ambulatory Visit: Payer: Self-pay | Admitting: Occupational Therapy

## 2020-08-24 ENCOUNTER — Encounter: Payer: Self-pay | Admitting: Occupational Therapy

## 2020-08-28 ENCOUNTER — Ambulatory Visit: Payer: Self-pay | Admitting: Occupational Therapy

## 2020-08-29 ENCOUNTER — Ambulatory Visit: Payer: Self-pay | Admitting: Occupational Therapy

## 2020-08-29 ENCOUNTER — Encounter: Payer: Self-pay | Admitting: Occupational Therapy

## 2020-08-31 ENCOUNTER — Encounter: Payer: Self-pay | Admitting: Occupational Therapy

## 2020-09-01 ENCOUNTER — Ambulatory Visit: Payer: Self-pay | Admitting: Occupational Therapy

## 2020-09-01 ENCOUNTER — Other Ambulatory Visit: Payer: Self-pay

## 2020-09-01 DIAGNOSIS — I890 Lymphedema, not elsewhere classified: Secondary | ICD-10-CM

## 2020-09-01 NOTE — Therapy (Signed)
[place] MAIN [facility] SERVICES [address] [place], [place], [address] Phone: [phone]   Fax:  [phone]  Occupational Therapy Treatment  Patient Details  Name: Jaclyn Kramer MRN: [id number] Date of Birth: Jan 08, 1956 Referring Provider (OT): Jeri Cos, [place]   Encounter Date: 09/01/2020   OT End of Session - 09/01/20 0926    Visit Number 2    Number of Visits 36    Date for OT Re-Evaluation 11/14/20    OT Start Time 0920    OT Stop Time 1000    OT Time Calculation (min) 40 min    Activity Tolerance Patient tolerated treatment well;No increased pain    Behavior During Therapy [facility] for tasks assessed/performed           No past medical history on file.  No past surgical history on file.  There were no vitals filed for this visit.   Subjective Assessment - 09/01/20 0927    Subjective  Jaclyn Kramer presents for OT visit 2/ 36 to address BLE Lymphedema. Pt has missed several preceeding scheduled appointments due to family caregiving responsibilities. Pt denies LE related leg pain ths morning. Pt reports she is still awaiting notification that Medicaid is in effect, so we postponed measuring for compression garment replacements today and performed manual therapy today.    Pertinent History Hx anemia, HTN, anxiety disorder, major depressive disorder, hx non-adherance to medical treatment;    Limitations limits ambulation, standing tolerance, transfers, bed mobility, ascending and descending stairs, difficulty fitting LB clothing and shoes. difficulty reaching feet (AROM) difficulty bathing    Repetition Increases Symptoms    Special Tests + Stemmer dorsal feet Lymphedema Life Impact Scale (LLIS) TBA next visit                        OT Treatments/Exercises (OP) - 09/01/20 0932      ADLs   ADL Education Given Yes      Manual Therapy   Manual Therapy Edema management;Manual Lymphatic Drainage (MLD);Compression Bandaging     Manual Lymphatic Drainage (MLD) MLD to LLE utilizing short neck sequence, abdominal  breathing, functional inguinal LN and proximal to distal J strokes from groin to feet.    Compression Bandaging Pt dons compression garments after manual therapy wth extra time (modified independence)                  OT Education - 09/01/20 0957    Education Details Continued Pt/ CG edu for lymphedema self care  and home program throughout session. Topics include multilayer, gradient compression wrapping, simple self-MLD, therapeutic lymphatic pumping exercises, skin/nail care, risk reduction factors and LE precautions, compression garments/recommendations and wear and care schedule and compression garment donning / doffing using assistive devices. All questions answered to the Pt's satisfaction, and Pt demonstrates understanding by report.    Person(s) Educated Patient    Methods Explanation;Demonstration    Comprehension Verbalized understanding;Returned demonstration;Need further instruction               OT Long Term Goals - 08/17/20 1156      OT LONG TERM GOAL #1   Title Given this patient's risk-adjustment variables, and the actual Intake functional score on the FOTO tool, patient will experience at least an increase in function of 4 points (to 68 or higher) to improve funtional performance in lymphedema self care to limit progression.    Baseline Mod A    Time 8  Period Weeks    Status New    Target Date 11/15/20      OT LONG TERM GOAL #2   Title Pt to achieve 3% RLE limb volume reduction from ankle to tibial tuberosity (A-D) to to improve ankle flexibility and to limit LE progression    Baseline Mod A    Time 8    Period Weeks    Status New   Met for RLE; LLE not met   Target Date 11/15/20      OT LONG TERM GOAL #3   Title Pt will be able to verbalize signs and symptoms of cellulitis infection and identify 4 common lymphedema precautions using printed resource for reference  (modified independence) to limit LE progression over time .    Baseline Mod A    Time 12    Period Weeks    Status New   Met for RLE with 34% limb volume reduction below the knee     OT LONG TERM GOAL #4   Title Pt will demonstrate at least 85%  compliance with all daily LE self-care home program  components, including skin care, lymphatic pumping therex, compression and simple self-MLD with modified independence (extra time) and limit LE progression.    Baseline mod A    Time 12    Period Weeks    Status New      OT LONG TERM GOAL #5   Title Once issued Pt will be able to don and doff appropriate compression garments and/ or devices using correct techniques and assistive devices with extra time (modified independence) by end of Intensive Phase CDT for optimal LE management to limit progression over time.    Baseline modified assistance (extra time)    Time 12    Period Weeks    Status Achieved    Target Date 11/15/20      OT LONG TERM GOAL #6   Title During self-management phase of CDT Pt will retain limb volume reductions achieved during Intensive Phase CDT with no more than 3% volume increase to limit LE progression and further functional decline.    Baseline Min A    Time 3    Period Months    Status On-going    Target Date 02/13/21      OT LONG TERM GOAL #7   Title Pt will replace compression garments/ devices q 3-6 months and PRN to limit LE progression and infection risk, and to limit further functional decline.    Baseline Max A    Time 3    Period Months    Status New    Target Date 11/15/20                 Plan - 09/01/20 0959    Clinical Impression Statement Pt tolerated MLD to LLE and concurrent skin care to increase hydration without increased pain. Pt modified independent with donning and doffing stockings before/after manual therapy. Cont as per POC.    OT Occupational Profile and History Comprehensive Assessment- Review of records and extensive  additional review of physical, cognitive, psychosocial history related to current functional performance    Occupational performance deficits (Please refer to evaluation for details): ADL's;Leisure;IADL's;Social Participation;Work;Other   parenting role, self esteme, body image   Body Structure / Function / Physical Skills ADL;Decreased knowledge of precautions;Decreased knowledge of use of DME;Flexibility;IADL;Pain;Skin integrity;Edema;Mobility;ROM;Balance    Rehab Potential Good    Clinical Decision Making Several treatment options, min-mod task modification necessary    Comorbidities  Affecting Occupational Performance: Presence of comorbidities impacting occupational performance    Comorbidities impacting occupational performance description: See SUBJECTIVE    Modification or Assistance to Complete Evaluation  Min-Moderate modification of tasks or assist with assess necessary to complete eval    OT Frequency 1x / week    OT Duration 12 weeks   and PRN   OT Treatment/Interventions Self-care/ADL training;Manual lymph drainage;Coping strategies training;Patient/family education;Therapeutic activities;Manual Therapy;DME and/or AE instruction;Other (comment);Compression bandaging;Therapeutic exercise   fibrosis techniques, skin care , fit with replacements for existing , worn out, custom compression garments.   Plan CDT x 1 weekly to reduce limb volume, improve skin hydration and limit infection risk while waiting for Medicaid approval to complete garment replacement. At present Pt has no health insurance. One Medicaid is in lace we'll measure and fit with replacements for existing custom compression garments and HOS devices. No bandaging; we'll use existing compression garments after each session. CDT to include MLD, skin care, ther ex and compression. Pt will use sequential pneumatic compression device (Flexitouch) between visits at home.    Recommended Other Services replace BLE custom Elvarex A-D, ccl  3 , flat knit stockings. Fit with BLE custom Jobst RELAX A-D to limit fibrosis formation and improve lymphatic function during HOS.    Consulted and Agree with Plan of Care Patient           Patient will benefit from skilled therapeutic intervention in order to improve the following deficits and impairments:   Body Structure / Function / Physical Skills: ADL,Decreased knowledge of precautions,Decreased knowledge of use of DME,Flexibility,IADL,Pain,Skin integrity,Edema,Mobility,ROM,Balance       Visit Diagnosis: Lymphedema, not elsewhere classified    Problem List Patient Active Problem List   Diagnosis Date Noted  . 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 Anxiety state 11/28/2009  . Depressive disorder 11/28/2009  . Essential hypertension 11/28/2009    Andrey Spearman, MS, OTR/L, [facility] 09/01/20 10:01 AM   [place] MAIN [facility] SERVICES [address] [place], [place], [address] Phone: [phone]   Fax:  [phone]  Name: Jaclyn Kramer MRN: [id number] Date of Birth: 03/02/1956

## 2020-09-04 ENCOUNTER — Ambulatory Visit: Payer: Self-pay | Admitting: Occupational Therapy

## 2020-09-05 ENCOUNTER — Encounter: Payer: Self-pay | Admitting: Occupational Therapy

## 2020-09-07 ENCOUNTER — Encounter: Payer: Self-pay | Admitting: Occupational Therapy

## 2023-04-17 ENCOUNTER — Ambulatory Visit: Payer: Medicare HMO | Admitting: Podiatry
